# Patient Record
Sex: Female | Born: 1940 | Race: White | Hispanic: No | Marital: Married | Smoking: Current every day smoker
Health system: Southern US, Community
[De-identification: ages and names within clinical notes are randomized; demographics above are authoritative.]

## PROBLEM LIST (undated history)

## (undated) DIAGNOSIS — C50919 Malignant neoplasm of unspecified site of unspecified female breast: Secondary | ICD-10-CM

## (undated) DIAGNOSIS — Q892 Congenital malformations of other endocrine glands: Secondary | ICD-10-CM

## (undated) DIAGNOSIS — Z8669 Personal history of other diseases of the nervous system and sense organs: Secondary | ICD-10-CM

## (undated) DIAGNOSIS — I10 Essential (primary) hypertension: Secondary | ICD-10-CM

## (undated) DIAGNOSIS — IMO0002 Reserved for concepts with insufficient information to code with codable children: Secondary | ICD-10-CM

## (undated) DIAGNOSIS — Z923 Personal history of irradiation: Secondary | ICD-10-CM

## (undated) DIAGNOSIS — M329 Systemic lupus erythematosus, unspecified: Secondary | ICD-10-CM

## (undated) HISTORY — DX: Personal history of irradiation: Z92.3

## (undated) HISTORY — DX: Reserved for concepts with insufficient information to code with codable children: IMO0002

## (undated) HISTORY — DX: Malignant neoplasm of unspecified site of unspecified female breast: C50.919

## (undated) HISTORY — DX: Systemic lupus erythematosus, unspecified: M32.9

## (undated) HISTORY — DX: Personal history of other diseases of the nervous system and sense organs: Z86.69

## (undated) HISTORY — PX: TUBAL LIGATION: SHX77

## (undated) HISTORY — DX: Congenital malformations of other endocrine glands: Q89.2

## (undated) HISTORY — DX: Essential (primary) hypertension: I10

---

## 2007-07-20 ENCOUNTER — Ambulatory Visit (HOSPITAL_BASED_OUTPATIENT_CLINIC_OR_DEPARTMENT_OTHER): Admission: RE | Admit: 2007-07-20 | Discharge: 2007-07-20 | Payer: Self-pay | Admitting: Otolaryngology

## 2011-09-07 ENCOUNTER — Other Ambulatory Visit: Payer: Self-pay | Admitting: Internal Medicine

## 2011-09-07 DIAGNOSIS — N63 Unspecified lump in unspecified breast: Secondary | ICD-10-CM

## 2011-09-14 ENCOUNTER — Other Ambulatory Visit: Payer: Self-pay | Admitting: Internal Medicine

## 2011-09-14 ENCOUNTER — Ambulatory Visit
Admission: RE | Admit: 2011-09-14 | Discharge: 2011-09-14 | Disposition: A | Discharge status: Home / Self Care | Payer: Medicare Other | Source: Ambulatory Visit | Attending: Internal Medicine | Admitting: Internal Medicine

## 2011-09-14 DIAGNOSIS — N63 Unspecified lump in unspecified breast: Secondary | ICD-10-CM

## 2011-09-14 DIAGNOSIS — C50919 Malignant neoplasm of unspecified site of unspecified female breast: Secondary | ICD-10-CM

## 2011-09-14 HISTORY — DX: Malignant neoplasm of unspecified site of unspecified female breast: C50.919

## 2011-09-15 ENCOUNTER — Other Ambulatory Visit: Payer: Self-pay | Admitting: Internal Medicine

## 2011-09-15 DIAGNOSIS — C50919 Malignant neoplasm of unspecified site of unspecified female breast: Secondary | ICD-10-CM

## 2011-09-17 ENCOUNTER — Ambulatory Visit
Admission: RE | Admit: 2011-09-17 | Discharge: 2011-09-17 | Disposition: A | Discharge status: Home / Self Care | Payer: Medicare Other | Source: Ambulatory Visit | Attending: Internal Medicine | Admitting: Internal Medicine

## 2011-09-17 DIAGNOSIS — C50919 Malignant neoplasm of unspecified site of unspecified female breast: Secondary | ICD-10-CM

## 2011-09-17 MED ORDER — GADOBENATE DIMEGLUMINE 529 MG/ML IV SOLN
5.0000 mL | Freq: Once | INTRAVENOUS | Status: AC | PRN
  Administered 2011-09-17: 5 mL via INTRAVENOUS

## 2011-09-22 ENCOUNTER — Encounter (HOSPITAL_BASED_OUTPATIENT_CLINIC_OR_DEPARTMENT_OTHER): Payer: Medicare Other | Admitting: Oncology

## 2011-09-22 ENCOUNTER — Other Ambulatory Visit: Payer: Self-pay | Admitting: Oncology

## 2011-09-22 ENCOUNTER — Ambulatory Visit: Payer: Medicare Other | Admitting: Physical Therapy

## 2011-09-22 ENCOUNTER — Ambulatory Visit (HOSPITAL_BASED_OUTPATIENT_CLINIC_OR_DEPARTMENT_OTHER): Payer: Medicare Other | Admitting: General Surgery

## 2011-09-22 ENCOUNTER — Encounter (INDEPENDENT_AMBULATORY_CARE_PROVIDER_SITE_OTHER): Payer: Self-pay | Admitting: General Surgery

## 2011-09-22 VITALS — BP 171/82 | HR 89 | Temp 98.5°F | Resp 20 | Ht 59.2 in | Wt 107.4 lb

## 2011-09-22 DIAGNOSIS — Z170 Estrogen receptor positive status [ER+]: Secondary | ICD-10-CM

## 2011-09-22 DIAGNOSIS — M810 Age-related osteoporosis without current pathological fracture: Secondary | ICD-10-CM

## 2011-09-22 DIAGNOSIS — C50919 Malignant neoplasm of unspecified site of unspecified female breast: Secondary | ICD-10-CM

## 2011-09-22 DIAGNOSIS — C50419 Malignant neoplasm of upper-outer quadrant of unspecified female breast: Secondary | ICD-10-CM

## 2011-09-22 LAB — COMPREHENSIVE METABOLIC PANEL
AST: 17 U/L (ref 0–37)
BUN: 19 mg/dL (ref 6–23)
Calcium: 9.6 mg/dL (ref 8.4–10.5)
Chloride: 99 mEq/L (ref 96–112)
Creatinine, Ser: 0.99 mg/dL (ref 0.50–1.10)

## 2011-09-22 LAB — CBC WITH DIFFERENTIAL/PLATELET
Basophils Absolute: 0 10*3/uL (ref 0.0–0.1)
EOS%: 1.1 % (ref 0.0–7.0)
HCT: 41.6 % (ref 34.8–46.6)
HGB: 14.4 g/dL (ref 11.6–15.9)
LYMPH%: 25.7 % (ref 14.0–49.7)
MCH: 31.8 pg (ref 25.1–34.0)
MCV: 92.2 fL (ref 79.5–101.0)
MONO%: 6.7 % (ref 0.0–14.0)
NEUT%: 66.2 % (ref 38.4–76.8)
Platelets: 218 10*3/uL (ref 145–400)
lymph#: 1.7 10*3/uL (ref 0.9–3.3)

## 2011-09-22 NOTE — Progress Notes (Signed)
Subjective:   New diagnosis of breast cancer  Patient ID: Tammie Carter, female   DOB: [birth year], 70 y.o.   MRN: [id number]  HPI Patient is a pleasant 70 year old female patient of Dr. Timothy Carter seen in the breast multidisciplinary clinic for a new diagnosis of left breast cancer. The patient was recently able to feel a mass on breast self-exam in the upper outer quadrant of the left breast. She has not gone for routine screening. She presented to Dr. Timothy Carter and was referred to the breast center for further evaluation. I personally reviewed all her imaging and biopsy reports. Mammogram revealed a spiculated 2.5 cm mass in the upper outer quadrant of the left breast. Ultrasound confirmed a hypoechoic mass in the same area measuring 2.2 cm. 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 Subsequently bilateral breast MRI has been performed indicating a 2.3 cm mass at the area of the known malignancy and no other breast lesions. There are noted to be some mildly enlarged lymph nodes in the axilla on the left up to 1.2 cm. She has no previous history of breast disease or breast biopsy. No pain or change in her overall health. No nipple discharge or skin changes Past Medical History  Diagnosis Date  . Lupus   . Hypertension    Past Surgical History  Procedure Date  . Cesarean section    Current Outpatient Prescriptions  Medication Sig Dispense Refill  . lisinopril (PRINIVIL,ZESTRIL) 10 MG tablet Take 10 mg by mouth as needed.        . predniSONE (DELTASONE) 1 MG tablet Take 1 mg by mouth as needed.         Allergies  Allergen Reactions  . Erythromycin     Patient reports reaction to ALL antibiotics  . Penicillins   . Penicillins Cross Reactors    History  Substance Use Topics  . Smoking status: Never Smoker   . Smokeless tobacco: Not on file  . Alcohol Use: No    Review of Systems    Constitutional: Negative.   HENT: Negative.   Respiratory: Negative.   Cardiovascular: Negative.   Gastrointestinal: Negative.        Never had colonoscopy  Musculoskeletal: Negative.   Skin: Negative.   Hematological: Negative.        Objective:   Physical Exam Gen.: Well-developed alert Caucasian female in no distress Skin: Warm and dry without rash or infection HEENT: No palpable masses or thyromegaly. Sclera nonicteric. Oropharynx clear. Lymph nodes: No palpable cervical, supraclavicular, or axillary nodes. Breasts: In the upper outer quadrant of the left breast near the tail of Tammie Carter is a fairly soft but distinct freely movable mass. No overlying skin changes and no evidence of fixation. There are no other palpable masses in either breast. Again I cannot detect any axillary adenopathy. Lungs: Clear without wheezing or increased work of breathing Cardiovascular: Regular rate and rhythm without murmur. No edema or JVD. Abdomen: Soft and nontender. No discernible masses or organomegaly. Extremities: No joint swelling or deformity Neurologic: Alert and fully oriented. Gait normal.    Assessment:     70 year old female with new diagnosis of clinical T2 N0M0 ER PR positive HER-2 negative an invasive ductal carcinoma of the left breast. We discussed surgical options in detail including mastectomy or lumpectomy. She prefers breast conservation and I think would be a good candidate for lumpectomy. We discussed sentinel  lymph node biopsy for staging. We discussed the nature of the procedures, recovery, risks of anesthetic complications, bleeding, infection, possible need for further surgery based on final pathology.    Plan:     Left breast lumpectomy and sentinel lymph node biopsy which we will plan as an outpatient under general anesthesia. She has had reactions to multiple antibiotics and we will not use a preoperative antibiotic and she understands some slight risk of infection.  Likely postoperative radiation and hormonal treatment.

## 2011-09-22 NOTE — Patient Instructions (Signed)
Please call for any questions or concerns prior to your surgery.

## 2011-09-23 ENCOUNTER — Other Ambulatory Visit (INDEPENDENT_AMBULATORY_CARE_PROVIDER_SITE_OTHER): Payer: Self-pay | Admitting: General Surgery

## 2011-09-23 DIAGNOSIS — C50912 Malignant neoplasm of unspecified site of left female breast: Secondary | ICD-10-CM

## 2011-09-23 LAB — SEDIMENTATION RATE: Sed Rate: 5 mm/hr (ref 0–22)

## 2011-09-28 ENCOUNTER — Other Ambulatory Visit (HOSPITAL_COMMUNITY): Payer: Medicare Other

## 2011-09-28 ENCOUNTER — Ambulatory Visit (HOSPITAL_BASED_OUTPATIENT_CLINIC_OR_DEPARTMENT_OTHER): Admission: RE | Admit: 2011-09-28 | Payer: Medicare Other | Source: Ambulatory Visit | Admitting: General Surgery

## 2011-09-29 ENCOUNTER — Ambulatory Visit (HOSPITAL_COMMUNITY)
Admission: RE | Admit: 2011-09-29 | Discharge: 2011-09-29 | Disposition: A | Discharge status: Home / Self Care | Payer: Medicare Other | Source: Ambulatory Visit | Attending: Oncology | Admitting: Oncology

## 2011-09-29 DIAGNOSIS — C50919 Malignant neoplasm of unspecified site of unspecified female breast: Secondary | ICD-10-CM | POA: Insufficient documentation

## 2011-09-29 MED ORDER — IOHEXOL 300 MG/ML  SOLN
80.0000 mL | Freq: Once | INTRAMUSCULAR | Status: AC | PRN
  Administered 2011-09-29: 80 mL via INTRAVENOUS

## 2011-10-19 ENCOUNTER — Ambulatory Visit: Payer: Medicare Other | Admitting: Radiation Oncology

## 2011-10-25 ENCOUNTER — Telehealth: Payer: Self-pay | Admitting: *Deleted

## 2011-10-25 NOTE — Telephone Encounter (Signed)
Call received from scheduling per pt's call to verify appt date. Pt is scheduled for md f/u pm 11/16. Christal is asking if her appt with md should be before or after surgery by Dr Johna Sheriff which is after 11/16.  Per md above inquiry needs to be discussed with Museum/gallery exhibitions officer.  This rn call Dawn and inquired above.  Dawn will f/u with inquiry.

## 2011-10-26 ENCOUNTER — Telehealth: Payer: Self-pay | Admitting: *Deleted

## 2011-10-26 ENCOUNTER — Other Ambulatory Visit (INDEPENDENT_AMBULATORY_CARE_PROVIDER_SITE_OTHER): Payer: Self-pay | Admitting: General Surgery

## 2011-10-26 LAB — C-REACTIVE PROTEIN: CRP: 0.14 mg/dL (ref <0.60)

## 2011-10-26 NOTE — Telephone Encounter (Signed)
Left message for pt to call back concerning appt with Dr. Darnelle Catalan on 11/05/11.  Per Dr. Darnelle Catalan, pt should come in to be assessed on tolerance of Femara.

## 2011-10-28 ENCOUNTER — Telehealth: Payer: Self-pay | Admitting: *Deleted

## 2011-10-28 NOTE — Telephone Encounter (Signed)
Spoke to pt concerning appt with Dr. Darnelle Catalan on 11/05/11.  Pt questioned why she needed to keep appt?  Informed pt Dr. Darnelle Catalan wants to see how she is responding to Femara and how she is tolerating the medication.  Pt verbalized understanding.  Encourage pt to call with needs or concerns.  Received verbal understanding.  Contact information given.

## 2011-11-03 ENCOUNTER — Ambulatory Visit: Admission: RE | Admit: 2011-11-03 | Payer: Medicare Other | Source: Ambulatory Visit

## 2011-11-03 ENCOUNTER — Ambulatory Visit: Payer: Medicare Other | Admitting: Radiation Oncology

## 2011-11-04 ENCOUNTER — Encounter: Payer: Self-pay | Admitting: *Deleted

## 2011-11-05 ENCOUNTER — Telehealth: Payer: Self-pay | Admitting: Oncology

## 2011-11-05 ENCOUNTER — Other Ambulatory Visit: Payer: Self-pay | Admitting: Oncology

## 2011-11-05 ENCOUNTER — Ambulatory Visit (HOSPITAL_BASED_OUTPATIENT_CLINIC_OR_DEPARTMENT_OTHER): Payer: Medicare Other | Admitting: Oncology

## 2011-11-05 ENCOUNTER — Other Ambulatory Visit (HOSPITAL_BASED_OUTPATIENT_CLINIC_OR_DEPARTMENT_OTHER): Payer: Medicare Other | Admitting: Lab

## 2011-11-05 VITALS — BP 173/96 | HR 79 | Temp 97.5°F | Ht 59.2 in | Wt 109.2 lb

## 2011-11-05 DIAGNOSIS — Z170 Estrogen receptor positive status [ER+]: Secondary | ICD-10-CM

## 2011-11-05 DIAGNOSIS — C50919 Malignant neoplasm of unspecified site of unspecified female breast: Secondary | ICD-10-CM

## 2011-11-05 DIAGNOSIS — C50419 Malignant neoplasm of upper-outer quadrant of unspecified female breast: Secondary | ICD-10-CM

## 2011-11-05 DIAGNOSIS — I10 Essential (primary) hypertension: Secondary | ICD-10-CM | POA: Insufficient documentation

## 2011-11-05 DIAGNOSIS — M810 Age-related osteoporosis without current pathological fracture: Secondary | ICD-10-CM | POA: Insufficient documentation

## 2011-11-05 DIAGNOSIS — F17201 Nicotine dependence, unspecified, in remission: Secondary | ICD-10-CM

## 2011-11-05 DIAGNOSIS — M329 Systemic lupus erythematosus, unspecified: Secondary | ICD-10-CM | POA: Insufficient documentation

## 2011-11-05 DIAGNOSIS — R5383 Other fatigue: Secondary | ICD-10-CM

## 2011-11-05 DIAGNOSIS — Z87798 Personal history of other (corrected) congenital malformations: Secondary | ICD-10-CM

## 2011-11-05 DIAGNOSIS — R5381 Other malaise: Secondary | ICD-10-CM

## 2011-11-05 LAB — CBC WITH DIFFERENTIAL/PLATELET
BASO%: 0.5 % (ref 0.0–2.0)
EOS%: 1.2 % (ref 0.0–7.0)
MCH: 31.6 pg (ref 25.1–34.0)
MCV: 93.3 fL (ref 79.5–101.0)
MONO%: 6.2 % (ref 0.0–14.0)
NEUT#: 5 10*3/uL (ref 1.5–6.5)
RBC: 4.45 10*6/uL (ref 3.70–5.45)
RDW: 12.9 % (ref 11.2–14.5)

## 2011-11-05 NOTE — Telephone Encounter (Signed)
Gv pt appt for [id number] °

## 2011-11-05 NOTE — Progress Notes (Signed)
ID: Benna Dunks   Interval History:  Brealyn returns today with her husband [name] for followup of her breast cancer. At the last visit here we had set her up for surgery, but for insurance reasons she decided to delay that until January. We wrote her for letrozole but when she tried to get the drug initially they wanted to charger more than $400 a month. She eventually work it all out and was able to get it for the correct price of $13 a month. She only started a week ago. So far she is having no side effects from  ROS:  She describes herself is moderately fatigued and Tom tells me that she is sleeping a bit more than usual. She has had no fevers no rash no altered taste or loss of appetite, no cough or phlegm production no change in bowel or bladder habits no drenching sweats. In short there are no symptoms suggestive of a lupus flare within the moderate fatigue. A detailed review of systems was otherwise unremarkable  Medications: I have reviewed the patient's current medications.   Current Outpatient Prescriptions  Medication Sig Dispense Refill  . letrozole (FEMARA) 2.5 MG tablet Take 2.5 mg by mouth daily.        Marland Kitchen lisinopril (PRINIVIL,ZESTRIL) 10 MG tablet Take 10 mg by mouth daily.       . predniSONE (DELTASONE) 1 MG tablet Take 1 mg by mouth as needed.           Objective: Vital signs in last 24 hours: BP 173/96  Pulse 79  Temp 97.5 F (36.4 C)  Ht 4' 11.2" (1.504 m)  Wt 109 lb 3.2 oz (49.533 kg)  BMI 21.91 kg/m2   Physical Exam:    Sclerae unicteric  Oropharynx clear  Lungs clear -- no rales or rhonchi  Heart regular rate and rhythm  Abdomen benign  MSK no focal spinal tenderness, no peripheral edema  Neuro nonfocal  Breast exam: Right breast no suspicious findings; left breast shows the mass most easily palpated when coming and laterally into the upper outer quadrant. There is no skin change or erythema no nipple retraction in the sitting position there is a small  palpable left axillary lymph node which is movable nontender and not associated with any swelling or inflammation  Lab Results:   CMP  Lab Results  Component Value Date   GLUCOSE 91 09/22/2011   ALT 13 09/22/2011   AST 17 09/22/2011   NA 134* 09/22/2011   K 4.5 09/22/2011   CL 99 09/22/2011   CREATININE 0.99 09/22/2011   BUN 19 09/22/2011   CO2 28 09/22/2011     Lab Results  Component Value Date   WBC 7.1 11/05/2011   HGB 14.1 11/05/2011   HCT 41.5 11/05/2011   MCV 93.3 11/05/2011   PLT 200 11/05/2011        Studies/Results: CT scan of the chest obtained 09/29/2011 the left breast mass measuring 2.0 cm cyst versus 2.3 cm by MRI). Evidence of axillary or other adenopathy and no evidence of metastatic disease  Assessment:  70 year old [place] woman status post left breast biopsy September of 2012 for what by MRI is a 2.3 cm mass and pathologically is a grade 1 invasive ductal carcinoma, strongly estrogen and progesterone receptor positive with an MIB-1-104% and no HER-2 amplification.   Plan:  The plan is to continue the letrozole and I have no objections to her I continue to take it right through the surgery and  radiation. She will call if she develops any side effects from this medication and she has a good understanding of the possible toxicities side effects and complications it can cause, as well as the benefits. Certainly she may already have some response by the time she has her definitive surgery under Dr. Nickola Major worked in January. Be seeing Dr. Dayton Scrape in her this month to discuss radiation issues, which of course will operation to lyse after the surgery. Made a Yeraldy a return appointment here for February with me she knows to call for any problems that may develop before that.  Upton Russey C 11/05/2011

## 2011-11-06 ENCOUNTER — Encounter: Payer: Self-pay | Admitting: *Deleted

## 2011-11-06 NOTE — Progress Notes (Signed)
Path:09/14/11: left breast bx 2 O'clock 'invasive ductal carcinoma,er/pr=positive,her2 =neg.  Married,3/4 living children,  Allergies; erythromycin,pcn,

## 2011-11-09 ENCOUNTER — Ambulatory Visit
Admission: RE | Admit: 2011-11-09 | Discharge: 2011-11-09 | Disposition: A | Discharge status: Home / Self Care | Payer: Medicare Other | Source: Ambulatory Visit | Attending: Radiation Oncology | Admitting: Radiation Oncology

## 2011-11-09 DIAGNOSIS — C50419 Malignant neoplasm of upper-outer quadrant of unspecified female breast: Secondary | ICD-10-CM | POA: Insufficient documentation

## 2011-11-09 DIAGNOSIS — C50919 Malignant neoplasm of unspecified site of unspecified female breast: Secondary | ICD-10-CM

## 2011-11-09 NOTE — Progress Notes (Signed)
CC:   Tammie Pounds, MD Tammie Carter. Hoxworth, M.D. Tammie Carter, M.D.  DIAGNOSIS:  Clinical stage II (T2 N0 M0) invasive ductal carcinoma of the left breast.  NARRATIVE:  [name] returns today for review.  Her surgery with Dr. Johna Sheriff has been postponed until January.  Dr. Darnelle Catalan has her on Femara in the meantime.  When I saw [name] in consultation I spoke with her primary care physician, Dr. Timothy Lasso, regarding her diagnosis of "lupus".  He felt that if she had a normal sedimentation rate and C- reactive protein then we can assume that she does not really have active lupus, therefore, she would probably do fine with radiation therapy. Again, there is no history of vasculitis.  Her sedimentation rate from 09/22/2011 was within normal limits at 5 and her C-reactive protein from 10/26/2011 was 0.14, also within normal limits.  She tells me that she takes 1 mg of prednisone p.r.n., but she does not take it for any specific symptoms.  PHYSICAL EXAMINATION:  General: Alert and oriented.  Vital signs:  Blood pressure 152/87, pulse 70 and regular, respiratory rate 20, and temperature 97.9.  Head and Neck Examination: Grossly unremarkable. Nodes: Without palpable cervical, supraclavicular, or axillary adenopathy.  Chest: Lungs clear.  Heart: Regular rate and rhythm. Breasts: There is a soft mass along the axillary tail of the left breast which is poorly defined measuring 2-2.5 cm in greatest diameter.  No other lesions are appreciated.  Right breast without masses or lesions. Abdomen:  Soft without masses or organomegaly.  Extremities:  Without edema.  IMPRESSION:  Clinical stage II (T2 N0 M0) invasive ductal carcinoma of the left breast.  [name] is awaiting her surgery in January.  She is to contact me with the date of her surgery, and I will see her for a followup visit 1-2 weeks postop.  We can discuss radiation therapy in greater detail at that time.  Based on her  sedimentation rate and C- reactive protein we should not expect any significant toxicity from her radition based on her diagnosis of "lupus."  Thirty minutes was spent face-to-face with the patient, primarily counseling the patient.    ______________________________ Maryln Gottron, M.D. RJM/MEDQ  D:  11/09/2011  T:  11/09/2011  Job:  [id number]

## 2011-11-09 NOTE — Progress Notes (Signed)
CONSULT FOR LEFT BREAST CANCER.  STARTED FEMARA Oct 28, 2011

## 2011-11-09 NOTE — Progress Notes (Signed)
Please see the Nurse Progress Note in the MD Initial Consult Encounter for this patient. 

## 2011-11-25 ENCOUNTER — Other Ambulatory Visit (INDEPENDENT_AMBULATORY_CARE_PROVIDER_SITE_OTHER): Payer: Self-pay | Admitting: General Surgery

## 2011-11-25 DIAGNOSIS — C50912 Malignant neoplasm of unspecified site of left female breast: Secondary | ICD-10-CM

## 2011-12-02 ENCOUNTER — Telehealth: Payer: Self-pay

## 2011-12-02 NOTE — Telephone Encounter (Signed)
Received call from pt reporting she has been on Femara for "a little over 2 months," and her side effects are getting progressively worse.  Pt reports joint pain, fatigue and GI upset.  Pt questions "what should I do?"    Note to Dr Thea Silversmith. dph

## 2011-12-02 NOTE — Telephone Encounter (Signed)
Returned pt's call re: question about Femara.   Per Dr Thea Silversmith, pt to d/c Femara x 6 weeks, then call our office to notify if sx resolved.    Pt verbalizes understanding. dph

## 2011-12-29 ENCOUNTER — Encounter (HOSPITAL_BASED_OUTPATIENT_CLINIC_OR_DEPARTMENT_OTHER): Payer: Self-pay | Admitting: *Deleted

## 2011-12-29 NOTE — Progress Notes (Signed)
To come in for bmet and ekg 

## 2011-12-30 ENCOUNTER — Encounter (INDEPENDENT_AMBULATORY_CARE_PROVIDER_SITE_OTHER): Payer: Self-pay | Admitting: General Surgery

## 2011-12-30 ENCOUNTER — Other Ambulatory Visit: Payer: Self-pay

## 2011-12-30 ENCOUNTER — Encounter (HOSPITAL_BASED_OUTPATIENT_CLINIC_OR_DEPARTMENT_OTHER)
Admission: RE | Admit: 2011-12-30 | Discharge: 2011-12-30 | Disposition: A | Discharge status: Home / Self Care | Payer: Medicare Other | Source: Ambulatory Visit | Attending: General Surgery | Admitting: General Surgery

## 2011-12-30 ENCOUNTER — Ambulatory Visit (INDEPENDENT_AMBULATORY_CARE_PROVIDER_SITE_OTHER): Payer: Medicare Other | Admitting: General Surgery

## 2011-12-30 DIAGNOSIS — C50419 Malignant neoplasm of upper-outer quadrant of unspecified female breast: Secondary | ICD-10-CM

## 2011-12-30 LAB — BASIC METABOLIC PANEL
GFR calc Af Amer: 53 mL/min — ABNORMAL LOW (ref >90)
GFR calc non Af Amer: 45 mL/min — ABNORMAL LOW (ref >90)
Potassium: 4.7 mEq/L (ref 3.5–5.1)
Sodium: 141 mEq/L (ref 135–145)

## 2011-12-30 NOTE — Progress Notes (Signed)
Subjective:   Cancer left breast  Patient ID: Tammie Carter, female   DOB: 04-Jan-1941, 71 y.o.   MRN: [id number]  HPI Patient returns for a preoperative visit prior to planned left breast lumpectomy and sentinel lymph node biopsy.   Patient is a pleasant 71 year old female patient of Dr. Timothy Lasso seen in the breast multidisciplinary clinic for a new diagnosis of left breast cancer. The patient was recently able to feel a mass on breast self-exam in the upper outer quadrant of the left breast. She has not gone for routine screening. She presented to Dr. Timothy Lasso and was referred to the breast center for further evaluation. I personally reviewed all her imaging and biopsy reports. Mammogram revealed a spiculated 2.5 cm mass in the upper outer quadrant of the left breast. Ultrasound confirmed a hypoechoic mass in the same area measuring 2.2 cm. 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 Subsequently bilateral breast MRI has been performed indicating a 2.3 cm mass at the area of the known malignancy and no other breast lesions. There are noted to be some mildly enlarged lymph nodes in the axilla on the left up to 1.2 cm. She has no previous history of breast disease or breast biopsy. No pain or change in her overall health. No nipple discharge or skin changes.  She was temporarily on letrozole but was unable to tolerate this due to side effects.  Past Medical History  Diagnosis Date  . Lupus   . Hypertension   . Breast cancer 09/14/11    left breast =invasive ductal ca dx  . Osteoporosis   . Hx of migraines   . Thyroglossal duct cyst    Past Surgical History  Procedure Date  . Tubal ligation   . Cesarean section     x2   Current Outpatient Prescriptions  Medication Sig Dispense Refill  . lisinopril (PRINIVIL,ZESTRIL) 10 MG tablet Take 10 mg by mouth daily.       . predniSONE (DELTASONE) 1  MG tablet Take 1 mg by mouth as needed.         Allergies  Allergen Reactions  . Codeine Anaphylaxis  . Erythromycin     Patient reports reaction to ALL antibiotics  . Letrozole     Made the pt's muscles and bones ache  . Penicillins   . Penicillins Cross Reactors     Review of Systems  Constitutional: Negative.   Respiratory: Negative.   Cardiovascular: Negative.   Gastrointestinal: Negative.        Objective:   Physical Exam General: Alert, well-developed Caucasian female, in no distress Skin: Warm and dry without rash or infection. HEENT: No palpable masses or thyromegaly. Sclera nonicteric. Pupils equal round and reactive. Oropharynx clear. Breasts: Again noted, unchanged since October, is a soft, discrete, freely movable, approximately 3 cm mass in the upper outer quadrant of the left breast in the area the tail of Spence. Again no palpable adenopathy. Lymph nodes: No cervical, supraclavicular, or inguinal nodes palpable. Lungs: Breath sounds clear and equal without increased work of breathing Cardiovascular: Regular rate and rhythm without murmur. No JVD or edema. Peripheral pulses intact. Abdomen: Nondistended. Soft and nontender. No masses palpable. No organomegaly. No palpable hernias. Extremities: No edema or joint swelling or deformity. No chronic venous stasis changes. Neurologic: Alert and fully oriented. Gait normal.     Assessment:     Cancer of  the left breast. We reviewed our plan for left breast lumpectomy and sentinel lymph node biopsy. We discussed the nature of the surgery and recovery as well as risks of bleeding, infection, possible need for further surgery based on final pathology.    Plan:     For left breast lumpectomy and left axillary sentinel lymph node biopsy.

## 2011-12-31 ENCOUNTER — Ambulatory Visit (HOSPITAL_BASED_OUTPATIENT_CLINIC_OR_DEPARTMENT_OTHER): Payer: Medicare Other | Admitting: Certified Registered Nurse Anesthetist

## 2011-12-31 ENCOUNTER — Encounter (HOSPITAL_BASED_OUTPATIENT_CLINIC_OR_DEPARTMENT_OTHER): Payer: Self-pay | Admitting: Certified Registered Nurse Anesthetist

## 2011-12-31 ENCOUNTER — Ambulatory Visit (HOSPITAL_COMMUNITY)
Admission: RE | Admit: 2011-12-31 | Discharge: 2011-12-31 | Disposition: A | Discharge status: Home / Self Care | Payer: Medicare Other | Source: Ambulatory Visit | Attending: General Surgery | Admitting: General Surgery

## 2011-12-31 ENCOUNTER — Other Ambulatory Visit (INDEPENDENT_AMBULATORY_CARE_PROVIDER_SITE_OTHER): Payer: Self-pay | Admitting: General Surgery

## 2011-12-31 ENCOUNTER — Encounter (HOSPITAL_BASED_OUTPATIENT_CLINIC_OR_DEPARTMENT_OTHER): Payer: Self-pay | Admitting: *Deleted

## 2011-12-31 ENCOUNTER — Ambulatory Visit (HOSPITAL_BASED_OUTPATIENT_CLINIC_OR_DEPARTMENT_OTHER)
Admission: RE | Admit: 2011-12-31 | Discharge: 2011-12-31 | Disposition: A | Discharge status: Home / Self Care | Payer: Medicare Other | Source: Ambulatory Visit | Attending: General Surgery | Admitting: General Surgery

## 2011-12-31 ENCOUNTER — Encounter (HOSPITAL_BASED_OUTPATIENT_CLINIC_OR_DEPARTMENT_OTHER)
Admission: RE | Disposition: A | Discharge status: Home / Self Care | Payer: Self-pay | Source: Ambulatory Visit | Attending: General Surgery

## 2011-12-31 DIAGNOSIS — D0590 Unspecified type of carcinoma in situ of unspecified breast: Secondary | ICD-10-CM | POA: Insufficient documentation

## 2011-12-31 DIAGNOSIS — M810 Age-related osteoporosis without current pathological fracture: Secondary | ICD-10-CM | POA: Insufficient documentation

## 2011-12-31 DIAGNOSIS — C50912 Malignant neoplasm of unspecified site of left female breast: Secondary | ICD-10-CM

## 2011-12-31 DIAGNOSIS — I10 Essential (primary) hypertension: Secondary | ICD-10-CM | POA: Insufficient documentation

## 2011-12-31 DIAGNOSIS — C50419 Malignant neoplasm of upper-outer quadrant of unspecified female breast: Secondary | ICD-10-CM | POA: Insufficient documentation

## 2011-12-31 DIAGNOSIS — C773 Secondary and unspecified malignant neoplasm of axilla and upper limb lymph nodes: Secondary | ICD-10-CM | POA: Insufficient documentation

## 2011-12-31 DIAGNOSIS — M329 Systemic lupus erythematosus, unspecified: Secondary | ICD-10-CM | POA: Insufficient documentation

## 2011-12-31 DIAGNOSIS — C50919 Malignant neoplasm of unspecified site of unspecified female breast: Secondary | ICD-10-CM

## 2011-12-31 DIAGNOSIS — Z87891 Personal history of nicotine dependence: Secondary | ICD-10-CM | POA: Insufficient documentation

## 2011-12-31 DIAGNOSIS — Z170 Estrogen receptor positive status [ER+]: Secondary | ICD-10-CM | POA: Insufficient documentation

## 2011-12-31 HISTORY — PX: BREAST LUMPECTOMY: SHX2

## 2011-12-31 SURGERY — BREAST LUMPECTOMY WITH SENTINEL LYMPH NODE BX
Anesthesia: General | Site: Breast | Laterality: Left | Wound class: Clean

## 2011-12-31 MED ORDER — DEXAMETHASONE SODIUM PHOSPHATE 4 MG/ML IJ SOLN
INTRAMUSCULAR | Status: DC | PRN
  Administered 2011-12-31: 10 mg via INTRAVENOUS

## 2011-12-31 MED ORDER — BUPIVACAINE-EPINEPHRINE 0.25% -1:200000 IJ SOLN
INTRAMUSCULAR | Status: DC | PRN
  Administered 2011-12-31: 30 mL

## 2011-12-31 MED ORDER — PROPOFOL 10 MG/ML IV EMUL
INTRAVENOUS | Status: DC | PRN
  Administered 2011-12-31: 120 mg via INTRAVENOUS

## 2011-12-31 MED ORDER — FENTANYL CITRATE 0.05 MG/ML IJ SOLN
50.0000 ug | INTRAMUSCULAR | Status: DC | PRN
  Administered 2011-12-31: 50 ug via INTRAVENOUS

## 2011-12-31 MED ORDER — MORPHINE SULFATE 2 MG/ML IJ SOLN: 0.0500 mg/kg | INTRAMUSCULAR | Status: DC | PRN

## 2011-12-31 MED ORDER — MORPHINE SULFATE 15 MG PO TABS: 15.0000 mg | ORAL_TABLET | ORAL | Status: AC | PRN

## 2011-12-31 MED ORDER — LIDOCAINE HCL (CARDIAC) 20 MG/ML IV SOLN
INTRAVENOUS | Status: DC | PRN
  Administered 2011-12-31: 60 mg via INTRAVENOUS

## 2011-12-31 MED ORDER — ACETAMINOPHEN 10 MG/ML IV SOLN: 1000.0000 mg | Freq: Once | INTRAVENOUS | Status: DC

## 2011-12-31 MED ORDER — FENTANYL CITRATE 0.05 MG/ML IJ SOLN
INTRAMUSCULAR | Status: DC | PRN
  Administered 2011-12-31: 25 ug via INTRAVENOUS
  Administered 2011-12-31: 50 ug via INTRAVENOUS
  Administered 2011-12-31: 25 ug via INTRAVENOUS

## 2011-12-31 MED ORDER — METOCLOPRAMIDE HCL 5 MG/ML IJ SOLN
INTRAMUSCULAR | Status: DC | PRN
  Administered 2011-12-31: 10 mg via INTRAVENOUS

## 2011-12-31 MED ORDER — FENTANYL CITRATE 0.05 MG/ML IJ SOLN: 25.0000 ug | INTRAMUSCULAR | Status: DC | PRN

## 2011-12-31 MED ORDER — TECHNETIUM TC 99M SULFUR COLLOID FILTERED
1.0000 | Freq: Once | INTRAVENOUS | Status: AC | PRN
Start: 2011-12-31 — End: 2011-12-31
  Administered 2011-12-31: 1 via INTRADERMAL

## 2011-12-31 MED ORDER — SODIUM CHLORIDE 0.9 % IJ SOLN
INTRAMUSCULAR | Status: DC | PRN
  Administered 2011-12-31: 08:00:00 via INTRAMUSCULAR

## 2011-12-31 MED ORDER — CIPROFLOXACIN IN D5W 400 MG/200ML IV SOLN
400.0000 mg | INTRAVENOUS | Status: AC
  Administered 2011-12-31: 400 mg via INTRAVENOUS

## 2011-12-31 MED ORDER — METOCLOPRAMIDE HCL 5 MG/ML IJ SOLN: 10.0000 mg | Freq: Once | INTRAMUSCULAR | Status: DC | PRN

## 2011-12-31 MED ORDER — LACTATED RINGERS IV SOLN
INTRAVENOUS | Status: DC
  Administered 2011-12-31 (×2): via INTRAVENOUS

## 2011-12-31 MED ORDER — MIDAZOLAM HCL 2 MG/2ML IJ SOLN
0.5000 mg | INTRAMUSCULAR | Status: DC | PRN
  Administered 2011-12-31: 1 mg via INTRAVENOUS

## 2011-12-31 MED ORDER — ONDANSETRON HCL 4 MG/2ML IJ SOLN
INTRAMUSCULAR | Status: DC | PRN
  Administered 2011-12-31: 4 mg via INTRAVENOUS

## 2011-12-31 SURGICAL SUPPLY — 61 items
APPLIER CLIP 11 MED OPEN (CLIP) ×2
BINDER BREAST MEDIUM (GAUZE/BANDAGES/DRESSINGS) ×2 IMPLANT
BLADE SURG 10 STRL SS (BLADE) ×2 IMPLANT
BLADE SURG 15 STRL LF DISP TIS (BLADE) ×1 IMPLANT
BLADE SURG 15 STRL SS (BLADE) ×1
CANISTER SUCTION 1200CC (MISCELLANEOUS) ×2 IMPLANT
CHLORAPREP W/TINT 26ML (MISCELLANEOUS) ×2 IMPLANT
CLIP APPLIE 11 MED OPEN (CLIP) ×1 IMPLANT
CLIP TI WIDE RED SMALL 6 (CLIP) ×2 IMPLANT
CLOTH BEACON ORANGE TIMEOUT ST (SAFETY) ×2 IMPLANT
COVER MAYO STAND STRL (DRAPES) ×2 IMPLANT
COVER PROBE W GEL 5X96 (DRAPES) ×2 IMPLANT
COVER TABLE BACK 60X90 (DRAPES) ×2 IMPLANT
DECANTER SPIKE VIAL GLASS SM (MISCELLANEOUS) IMPLANT
DERMABOND ADVANCED (GAUZE/BANDAGES/DRESSINGS) ×1
DERMABOND ADVANCED .7 DNX12 (GAUZE/BANDAGES/DRESSINGS) ×1 IMPLANT
DEVICE DUBIN W/COMP PLATE 8390 (MISCELLANEOUS) ×2 IMPLANT
DRAIN CHANNEL 19F RND (DRAIN) IMPLANT
DRAIN HEMOVAC 1/8 X 5 (WOUND CARE) IMPLANT
DRAPE LAPAROSCOPIC ABDOMINAL (DRAPES) ×2 IMPLANT
DRAPE UTILITY XL STRL (DRAPES) ×2 IMPLANT
ELECT COATED BLADE 2.86 ST (ELECTRODE) ×2 IMPLANT
ELECT REM PT RETURN 9FT ADLT (ELECTROSURGICAL) ×2
ELECTRODE REM PT RTRN 9FT ADLT (ELECTROSURGICAL) ×1 IMPLANT
EVACUATOR SILICONE 100CC (DRAIN) IMPLANT
GLOVE BIO SURGEON STRL SZ 6.5 (GLOVE) ×2 IMPLANT
GLOVE BIO SURGEON STRL SZ7 (GLOVE) ×2 IMPLANT
GLOVE BIOGEL PI IND STRL 7.5 (GLOVE) ×1 IMPLANT
GLOVE BIOGEL PI IND STRL 8 (GLOVE) ×1 IMPLANT
GLOVE BIOGEL PI INDICATOR 7.5 (GLOVE) ×1
GLOVE BIOGEL PI INDICATOR 8 (GLOVE) ×1
GLOVE SS BIOGEL STRL SZ 7.5 (GLOVE) ×1 IMPLANT
GLOVE SUPERSENSE BIOGEL SZ 7.5 (GLOVE) ×1
GOWN PREVENTION PLUS XLARGE (GOWN DISPOSABLE) ×2 IMPLANT
GOWN PREVENTION PLUS XXLARGE (GOWN DISPOSABLE) ×4 IMPLANT
KIT MARKER MARGIN INK (KITS) ×2 IMPLANT
NDL SAFETY ECLIPSE 18X1.5 (NEEDLE) ×1 IMPLANT
NEEDLE HYPO 18GX1.5 SHARP (NEEDLE) ×1
NEEDLE HYPO 25X1 1.5 SAFETY (NEEDLE) ×4 IMPLANT
NS IRRIG 1000ML POUR BTL (IV SOLUTION) ×2 IMPLANT
PACK BASIN DAY SURGERY FS (CUSTOM PROCEDURE TRAY) ×2 IMPLANT
PAD ALCOHOL SWAB (MISCELLANEOUS) ×2 IMPLANT
PENCIL BUTTON HOLSTER BLD 10FT (ELECTRODE) ×2 IMPLANT
PIN SAFETY STERILE (MISCELLANEOUS) IMPLANT
SLEEVE SCD COMPRESS KNEE MED (MISCELLANEOUS) ×2 IMPLANT
SPONGE LAP 18X18 X RAY DECT (DISPOSABLE) IMPLANT
SPONGE LAP 4X18 X RAY DECT (DISPOSABLE) ×2 IMPLANT
STAPLER VISISTAT 35W (STAPLE) IMPLANT
SUT ETHILON 3 0 FSL (SUTURE) IMPLANT
SUT MON AB 4-0 PC3 18 (SUTURE) IMPLANT
SUT MON AB 5-0 PS2 18 (SUTURE) ×2 IMPLANT
SUT SILK 3 0 SH 30 (SUTURE) IMPLANT
SUT VIC AB 3-0 SH 27 (SUTURE)
SUT VIC AB 3-0 SH 27X BRD (SUTURE) IMPLANT
SUT VICRYL 3-0 CR8 SH (SUTURE) ×2 IMPLANT
SYR CONTROL 10ML LL (SYRINGE) ×4 IMPLANT
TOWEL OR 17X24 6PK STRL BLUE (TOWEL DISPOSABLE) ×4 IMPLANT
TOWEL OR NON WOVEN STRL DISP B (DISPOSABLE) ×2 IMPLANT
TUBE CONNECTING 20X1/4 (TUBING) ×2 IMPLANT
WATER STERILE IRR 1000ML POUR (IV SOLUTION) IMPLANT
YANKAUER SUCT BULB TIP NO VENT (SUCTIONS) ×2 IMPLANT

## 2011-12-31 NOTE — Op Note (Signed)
Preoperative Diagnosis: left breast cancer  Postoprative Diagnosis: left breast cancer  Procedure: Procedure(s): Left BREAST LUMPECTOMY WITH SENTINEL LYMPH NODE BX, Blue dye injection left breast   Surgeon: Glenna Fellows T   Assistants: None  Anesthesia:  General LMA anesthesia  Indications:  Patient is a 71 year old female who has presented with a palpable mass in the upper-outer quadrant of the left breast near the tail of Spence. On imaging it is approximately 2.5 cm and biopsy has revealed invasive ductal carcinoma. We discussed surgical treatment options and elected to proceed with lumpectomy with axillary sentinel lymph node biopsy. We discussed the nature of the procedure, indications, risks of general anesthesia, bleeding, infection, and possibly for further surgery based on final pathology.   Procedure Detail: Prior to surgery the patient underwent injection of 1 mCi of technetium sulfur colloid around the left nipple. She was brought to the operating room, placed in supine position on operating table, laryngeal mask general anesthesia induced. The left breast chest axilla and upper arm were widely sterilely prepped and draped. Under sterile technique I had injected 10 cc of dilute methylene blue subcutaneously beneath the left nipple and massaged this for several minutes. The mass was palpable near the tail of Spence. I made a curvilinear incision directly over the mass and dissection was carried down into the subcutaneous tissue toward the breast capsule. Palpating the mass I deepened the dissection down in all directions taking a nice rim of normal breast tissue around the mass. The dissection was deepened down to the chest wall and the specimen was dissected up off of the edge of the pectoralis and the deep dissection was right down to the muscle. The mass appeared well contained within the specimen. It was oriented with a consent for permanent section. Complete hemostasis was  obtained with cautery. I was able to expose the axilla through the same incision. A hot area with the neoprobe was localized and using cautery and blunt dissection I dissected down into the axilla and using the neoprobe for guidance I came down to a fairly deep high lymph node that was slightly firm and enlarged but not obviously abnormal. It had high counts. Using blunt and cautery dissection it was dissected away entirely and removed. Ex vivo the node had counts of over 1000 and background of the axilla was less than 20. This was sent as a hot left axillary sentinel lymph node. There were no other palpable abnormalities in the axilla. The wound was then thoroughly irrigated with saline and infiltrated with Marcaine with epinephrine. Hemostasis was assured. The deeper tissues closed with interrupted 3-0 Vicryl as was the subcutaneous tissue and the skin was closed with subcuticular 4-0 Monocryl and Dermabond. Sponge needle and instrument counts were correct. The patient was taken to recovery in good condition.   Estimated Blood Loss:  Minimal         Drains: none  Blood Given: none          Specimens: #1 left breast lumpectomy #2 left axillary sentinel lymph node both for permanent pathology        Complications:  * No complications entered in OR log *         Disposition: PACU - hemodynamically stable.         Condition: stable  Mariella Saa MD, FACS  12/31/2011, 9:40 AM

## 2011-12-31 NOTE — Interval H&P Note (Signed)
History and Physical Interval Note:  12/31/2011 7:53 AM  Tammie Carter  has presented today for surgery, with the diagnosis of left breast cancer  The various methods of treatment have been discussed with the patient and family. After consideration of risks, benefits and other options for treatment, the patient has consented to  Procedure(s): BREAST LUMPECTOMY WITH SENTINEL LYMPH NODE BX as a surgical intervention .  The patients' history has been reviewed, patient examined, no change in status, stable for surgery.  I have reviewed the patients' chart and labs.  Questions were answered to the patient's satisfaction.     [name]

## 2011-12-31 NOTE — Anesthesia Procedure Notes (Signed)
Procedure Name: LMA Insertion Date/Time: 12/31/2011 8:08 AM Performed by: Saifan Rayford D Pre-anesthesia Checklist: Patient identified, Emergency Drugs available, Suction available and Patient being monitored Patient Re-evaluated:Patient Re-evaluated prior to inductionOxygen Delivery Method: Circle System Utilized Preoxygenation: Pre-oxygenation with 100% oxygen Intubation Type: IV induction Ventilation: Mask ventilation without difficulty LMA: LMA inserted LMA Size: 4.0 Number of attempts: 1 Placement Confirmation: positive ETCO2 Tube secured with: Tape Dental Injury: Teeth and Oropharynx as per pre-operative assessment

## 2011-12-31 NOTE — Anesthesia Preprocedure Evaluation (Signed)
Anesthesia Evaluation  Patient identified by MRN, date of birth, ID band Patient awake    Reviewed: Allergy & Precautions, H&P , NPO status , Patient's Chart, lab work & pertinent test results, reviewed documented beta blocker date and time   Airway Mallampati: II TM Distance: >3 FB Neck ROM: full    Dental   Pulmonary neg pulmonary ROS, former smoker         Cardiovascular hypertension, On Medications neg cardio ROS     Neuro/Psych Negative Neurological ROS  Negative Psych ROS   GI/Hepatic negative GI ROS, Neg liver ROS,   Endo/Other  Negative Endocrine ROS  Renal/GU negative Renal ROS  Genitourinary negative   Musculoskeletal   Abdominal   Peds  Hematology negative hematology ROS (+)   Anesthesia Other Findings See surgeon's H&P   SLE noted  Reproductive/Obstetrics negative OB ROS                           Anesthesia Physical Anesthesia Plan  ASA: II  Anesthesia Plan: General   Post-op Pain Management:    Induction: Intravenous  Airway Management Planned: LMA  Additional Equipment:   Intra-op Plan:   Post-operative Plan: Extubation in OR  Informed Consent: I have reviewed the patients History and Physical, chart, labs and discussed the procedure including the risks, benefits and alternatives for the proposed anesthesia with the patient or authorized representative who has indicated his/her understanding and acceptance.     Plan Discussed with: CRNA and Surgeon  Anesthesia Plan Comments:         Anesthesia Quick Evaluation

## 2011-12-31 NOTE — H&P (View-Only) (Signed)
Subjective:   Cancer left breast  Patient ID: Tammie Carter, female   DOB: 08/21/1941, 70 y.o.   MRN: [id number]  HPI Patient returns for a preoperative visit prior to planned left breast lumpectomy and sentinel lymph node biopsy.   Patient is a pleasant 70-year-old female patient of Dr. Russo seen in the breast multidisciplinary clinic for a new diagnosis of left breast cancer. The patient was recently able to feel a mass on breast self-exam in the upper outer quadrant of the left breast. She has not gone for routine screening. She presented to Dr. Russo and was referred to the breast center for further evaluation. I personally reviewed all her imaging and biopsy reports. Mammogram revealed a spiculated 2.5 cm mass in the upper outer quadrant of the left breast. Ultrasound confirmed a hypoechoic mass in the same area measuring 2.2 cm. 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 Subsequently bilateral breast MRI has been performed indicating a 2.3 cm mass at the area of the known malignancy and no other breast lesions. There are noted to be some mildly enlarged lymph nodes in the axilla on the left up to 1.2 cm. She has no previous history of breast disease or breast biopsy. No pain or change in her overall health. No nipple discharge or skin changes.  She was temporarily on letrozole but was unable to tolerate this due to side effects.  Past Medical History  Diagnosis Date  . Lupus   . Hypertension   . Breast cancer 09/14/11    left breast =invasive ductal ca dx  . Osteoporosis   . Hx of migraines   . Thyroglossal duct cyst    Past Surgical History  Procedure Date  . Tubal ligation   . Cesarean section     x2   Current Outpatient Prescriptions  Medication Sig Dispense Refill  . lisinopril (PRINIVIL,ZESTRIL) 10 MG tablet Take 10 mg by mouth daily.       . predniSONE (DELTASONE) 1  MG tablet Take 1 mg by mouth as needed.         Allergies  Allergen Reactions  . Codeine Anaphylaxis  . Erythromycin     Patient reports reaction to ALL antibiotics  . Letrozole     Made the pt's muscles and bones ache  . Penicillins   . Penicillins Cross Reactors     Review of Systems  Constitutional: Negative.   Respiratory: Negative.   Cardiovascular: Negative.   Gastrointestinal: Negative.        Objective:   Physical Exam General: Alert, well-developed Caucasian female, in no distress Skin: Warm and dry without rash or infection. HEENT: No palpable masses or thyromegaly. Sclera nonicteric. Pupils equal round and reactive. Oropharynx clear. Breasts: Again noted, unchanged since October, is a soft, discrete, freely movable, approximately 3 cm mass in the upper outer quadrant of the left breast in the area the tail of Spence. Again no palpable adenopathy. Lymph nodes: No cervical, supraclavicular, or inguinal nodes palpable. Lungs: Breath sounds clear and equal without increased work of breathing Cardiovascular: Regular rate and rhythm without murmur. No JVD or edema. Peripheral pulses intact. Abdomen: Nondistended. Soft and nontender. No masses palpable. No organomegaly. No palpable hernias. Extremities: No edema or joint swelling or deformity. No chronic venous stasis changes. Neurologic: Alert and fully oriented. Gait normal.     Assessment:     Cancer of   the left breast. We reviewed our plan for left breast lumpectomy and sentinel lymph node biopsy. We discussed the nature of the surgery and recovery as well as risks of bleeding, infection, possible need for further surgery based on final pathology.    Plan:     For left breast lumpectomy and left axillary sentinel lymph node biopsy.      

## 2011-12-31 NOTE — Anesthesia Postprocedure Evaluation (Signed)
Anesthesia Post Note  Patient: Tammie Carter  Procedure(s) Performed:  BREAST LUMPECTOMY WITH SENTINEL LYMPH NODE BX - left breast lumpectomy & sentinal lymph node biopsy  Anesthesia type: General  Patient location: PACU  Post pain: Pain level controlled  Post assessment: Patient's Cardiovascular Status Stable  Last Vitals:  Filed Vitals:   12/31/11 1015  BP: 133/73  Pulse: 88  Temp: 36.5 C  Resp: 16    Post vital signs: Reviewed and stable  Level of consciousness: alert  Complications: No apparent anesthesia complications

## 2011-12-31 NOTE — Transfer of Care (Signed)
Immediate Anesthesia Transfer of Care Note  Patient: Tammie Carter  Procedure(s) Performed:  BREAST LUMPECTOMY WITH SENTINEL LYMPH NODE BX - left breast lumpectomy & sentinal lymph node biopsy  Patient Location: PACU  Anesthesia Type: General  Level of Consciousness: awake, alert , oriented and patient cooperative  Airway & Oxygen Therapy: Patient Spontanous Breathing and Patient connected to face mask oxygen  Post-op Assessment: Report given to PACU RN, Post -op Vital signs reviewed and stable and Patient moving all extremities X 4  Post vital signs: Reviewed and stable Filed Vitals:   12/31/11 0740  BP: 139/82  Pulse: 74  Temp:   Resp: 16    Complications: No apparent anesthesia complications

## 2012-01-04 ENCOUNTER — Telehealth (INDEPENDENT_AMBULATORY_CARE_PROVIDER_SITE_OTHER): Payer: Self-pay | Admitting: General Surgery

## 2012-01-04 NOTE — Telephone Encounter (Signed)
Called pt and discussed path 

## 2012-01-10 ENCOUNTER — Encounter: Payer: Self-pay | Admitting: Radiation Oncology

## 2012-01-10 NOTE — Progress Notes (Signed)
71 year old female. Married. 3 of 4 children living.   Patient noted mass along the upper outer quadrant of the left breast in September 2012. Mammography found 2.6 cm spiculated mass within the upper outer quadrant. An ultrasound followed by an ultrasound guided biopsy on 09/14/2011 was diagnostic for invasive ductal carcinoma strongly ER, PR positive with a low proliferation marker of 4%. Breast MR on 09/17/11 showed a 2.3x1.8x2.3 cm macro lobulated mass within the upper outer left breast. Patient seen by Dr. Dayton Scrape 09/22/2011 in [facility]. Left breast lumpectomy with sentinel lymph node biopsy done 12/31/2011. Pathology of lumpectomy revealed invasive grade I ductal carcinoma spanning 2.2 cm associated with low grade ductal carcinoma in situ. Second focus of low grade ductal carcinoma in situ present measuring 0.3 cm. Margins of excision are negative. One lymph node positive for metastatic ductal carcinoma.  Allergic to several antibiotics No indication of a pacemaker No hx of radiation therapy

## 2012-01-11 ENCOUNTER — Ambulatory Visit
Admission: RE | Admit: 2012-01-11 | Discharge: 2012-01-11 | Disposition: A | Discharge status: Home / Self Care | Payer: Medicare Other | Source: Ambulatory Visit | Attending: Radiation Oncology | Admitting: Radiation Oncology

## 2012-01-11 ENCOUNTER — Encounter: Payer: Self-pay | Admitting: Radiation Oncology

## 2012-01-11 VITALS — BP 177/119 | HR 75 | Temp 98.1°F | Resp 20 | Ht 60.0 in | Wt 111.0 lb

## 2012-01-11 DIAGNOSIS — C50419 Malignant neoplasm of upper-outer quadrant of unspecified female breast: Secondary | ICD-10-CM

## 2012-01-11 DIAGNOSIS — C50919 Malignant neoplasm of unspecified site of unspecified female breast: Secondary | ICD-10-CM

## 2012-01-11 NOTE — Progress Notes (Signed)
Pt has not taken BP med this morning; states she "does not feel nervous".  Pt c/o pain, discomfort in L axilla and just above nipple L breast.

## 2012-01-11 NOTE — Progress Notes (Signed)
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 I first saw her in consultation on 09/22/2011. She presented with clinical stage TII N0 invasive ductal carcinoma of the left breast. She noted a mass along the upper outer quadrant of the left breast which on ultrasound, at 2:00, measured 2.2 x 1.9 x 2.1 cm, 12 cm from the nipple. Her tumor was strongly ER/PR positive with a low proliferation marker 4%. Breast MRI also showed several lymph nodes within the left axilla with slightly thickened cortices. She has a history of "lupus" for which he takes 1 mg of prednisone a day. I had a concern about whether or not she would be a candidate for radiation therapy, spoke with Dr. Timothy Lasso who felt that if her C. reactive protein and sedimentation rate were within normal limits, that she would not be considered is having active lupus. No history of a vasculitis. She delayed her definitive surgery to switch insurance plans. She is placed on letrozole by Dr. Darnelle Catalan and she took this from November 8 through December 13. She states that this was poorly tolerated because of insomnia, heartburn, and musculoskeletal discomfort. She want to have definitive surgery with Dr. Johna Sheriff on January 11. She is found to have a 2.2 cm invasive low-grade ductal carcinoma associated with a low-grade DCIS. A second focus of low-grade DCIS was present measuring 0.3 cm. Margins were negative, but close with the closest margins being 0.12 cm for invasive and in situ disease. There was no LV I. However, a single sentinel lymph node contained metastatic disease and there was extracapsular tumor extension. The tumor was strongly ER positive at 99% and PR positive at 85%. HER-2 neu was not amplified. She does report mild axillary discomfort since her  surgery. She is scheduled to see Dr. Darnelle Catalan for discussion of adjuvant therapy on February 14.  Physical examination: Alert and oriented. VS BP 177/119 (did not take blood pressure medication this morning) pulse 75, temp 98.1, respiratory rate 22 Head and neck examination: Grossly nonfocal. Nodes: There is no palpable cervical, supraclavicular, or axillary lymphadenopathy. There is a 2.5-3.0 cm lymphocele or seroma within the left axilla superior to her sentinel lymph node biopsy wound which is healing well. This is tender to palpation. Chest: Lungs clear. Breasts: There is a poor cystectomy wound along the upper outer aspect of left breast from 1 to 2:30. The wound is healing well. There is erythema surrounding the nipple areolar complex, presumably from the sentinel lymph node biopsy isotope tracer. No masses are appreciated. Right breast without masses or lesions. Abdomen without organomegaly. Extremities without edema.  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 Based on the Z- 11 trial there is no need to proceed with a completion axillary node dissection. She is a candidate for breast and nodal irradiation. I anticipate treating her with "high breast tangents" to cover the axilla. She does not to be a candidate for deep inspiration and breath-hold to avoid cardiac irradiation since she cannot hold her breath for more than 10 and 15 seconds, presumably from her previous smoking history. However, I think these can safely avoid the heart since she does have a lateral breast primary. I discussed the potential acute and toxicities of radiation therapy and she wishes to proceed as outlined.  Consent was signed today. She'll see Dr. Darnelle Catalan of February 14 and return here for treatment planning on February 18 unless Dr. Darnelle Catalan feels that she would benefit from chemotherapy. She will be offered adjuvant hormone therapy following completion of radiation therapy.  45  minutes was spent face-to-face with the patient, primarily counseling the patient and coordinating her care.

## 2012-01-11 NOTE — Progress Notes (Signed)
Encounter addended by: Ardell Isaacs, RN on: 01/11/2012  4:30 PM<BR>     Documentation filed: Charges VN

## 2012-01-11 NOTE — Progress Notes (Signed)
Please see the Nurse Progress Note in the MD Initial Consult Encounter for this patient. 

## 2012-02-03 ENCOUNTER — Other Ambulatory Visit (HOSPITAL_BASED_OUTPATIENT_CLINIC_OR_DEPARTMENT_OTHER): Payer: Medicare Other | Admitting: Lab

## 2012-02-03 ENCOUNTER — Ambulatory Visit (HOSPITAL_BASED_OUTPATIENT_CLINIC_OR_DEPARTMENT_OTHER): Payer: Medicare Other | Admitting: Oncology

## 2012-02-03 ENCOUNTER — Telehealth: Payer: Self-pay | Admitting: Oncology

## 2012-02-03 VITALS — BP 153/90 | HR 80 | Temp 97.5°F | Ht 60.0 in | Wt 109.9 lb

## 2012-02-03 DIAGNOSIS — C50919 Malignant neoplasm of unspecified site of unspecified female breast: Secondary | ICD-10-CM

## 2012-02-03 LAB — CBC WITH DIFFERENTIAL/PLATELET
BASO%: 0.4 % (ref 0.0–2.0)
LYMPH%: 19 % (ref 14.0–49.7)
MCHC: 34.1 g/dL (ref 31.5–36.0)
MCV: 91.9 fL (ref 79.5–101.0)
MONO%: 5.7 % (ref 0.0–14.0)
Platelets: 289 10*3/uL (ref 145–400)
RBC: 4.07 10*6/uL (ref 3.70–5.45)
WBC: 7.2 10*3/uL (ref 3.9–10.3)

## 2012-02-03 LAB — COMPREHENSIVE METABOLIC PANEL
ALT: 14 U/L (ref 0–35)
AST: 16 U/L (ref 0–37)
Alkaline Phosphatase: 71 U/L (ref 39–117)
Sodium: 142 mEq/L (ref 135–145)
Total Bilirubin: 0.3 mg/dL (ref 0.3–1.2)
Total Protein: 6.3 g/dL (ref 6.0–8.3)

## 2012-02-03 NOTE — Telephone Encounter (Signed)
S/w the pt's husband and he is aware of the April 2013 appts. Pt is aware of the bone density appt

## 2012-02-03 NOTE — Progress Notes (Signed)
ID: Tammie Carter   DOB: 28-Sep-1941  MR#: [id number]  NFA#:[id number]  HISTORY OF PRESENT ILLNESS: Tammie Carter is a 71 year old [place] woman referred by Tammie Carter for evaluation and treatment in the setting of newly diagnosed breast cancer.   The patient herself palpated a mass in her left breast while showering.  She waited a couple of weeks to see if it would go away, and as it did not she brought it to Tammie Carter's attention.  He set her up for bilateral diagnostic mammography which took place at the breast Carter on September 14, 2011.  This showed a spiculated mass in the upper outer quadrant of the left breast measuring 2.6 cm by mammography.  The right breast was unremarkable.  Tammie Carter, who performed that test, was able to palpate a firm, mobile mass in the upper outer quadrant of the left breast measuring by exam approximately 2.5 cm.  There was no palpable left axillary adenopathy.  She proceeded to left breast ultrasound which showed an irregular hypoechoic mass corresponding to the palpable finding and measuring 2.2 cm by ultrasound.  The axilla demonstrated a lymph node with mild focal cortical thickening but no additional abnormalities.   Biopsy was performed the same day and showed ([id number]) an invasive ductal carcinoma, grade 1, 99% estrogen receptor and 85% progesterone receptor positive, the MIB-1 was 4%.  There was no evidence of HER2 amplification.   With this information, the patient was referred to Tammie Carter, and bilateral breast MRIs were obtained September 17, 2011.  In the upper outer quadrant of the left breast, there was a 2.3 cm enhancing, macrolobulated mass with clip artifact.  There were several lymph nodes in the left axilla with slightly thickened cortices; the largest of these measured 1.2 cm.   INTERVAL HISTORY: Tammie Carter with her husband Tammie Carter in their daughter for followup of her breast cancer. Since her last visit here she had her definitive  left lumpectomy and sentinel lymph node sampling under been Tammie Carter LP 12/31/2011. The results are discussed below.  PAST MEDICAL HISTORY: Past Medical History  Diagnosis Date  . Lupus   . Hypertension   . Osteoporosis   . Hx of migraines   . Thyroglossal duct cyst   . Breast cancer 09/14/11    left breast =invasive ductal ca dx  Significant for systemic lupus erythematosus evaluated by Tammie Carter who, in September 2008, obtained an anti-DNA double-stranded antibody which was negative, SSA, SSB, Smith, ribonucleic protein antibody and scleroderma antibodies, all of which were negative.  The antineutrophil cytoplasmic IgG antibody was negative as well.  There was no lupus anticoagulant.  The C-reactive protein was only mildly elevated at 0.8, and there was no detectable M spike by SPEP.  Beta-2 glycoprotein antibodies and anticardiolipin antibodies were negative.  However, the patient had a positive ANA and an elevated sed rate, in addition to the arthritis, muscle pain, fatigue, all of which have improved on prednisone (she was treated initially with hydroxychloroquine with a total body rash).  Other medical problems include hypertension, glucose intolerance while on prednisone, remote history of migraines, osteoporosis, history of tobacco abuse, 30 pack years, the patient quitting 3 years ago, history of thyroglossal duct removal, history of C-section x2.  PAST SURGICAL HISTORY: Past Surgical History  Procedure Date  . Tubal ligation   . Cesarean section     x2  . Breast lumpectomy 12/31/11    L breast, sn bx, node positive, ER/PR+, hER 2 NEG  FAMILY HISTORY Family History  Problem Relation Age of Onset  . Breast cancer Sister 59    now age 75 stage iv  . Cancer Sister     breast  . Heart disease Mother   . Heart disease Father   The patient's father died at the age of 68 from a stroke.  The patient's mother died at the age of 79 from heart problems.  She had 2 brothers and 1  sister, her sister now 21 was diagnosed with breast cancer at the age of 90; unfortunately, she currently has stage IV disease.  There is no other cancer in the family to her knowledge.  GYNECOLOGIC HISTORY:She had menarche age 71, last period age 83.  She never took hormone replacement therapy.  She is GX P4, first pregnancy to term at age 38.  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  Her husband, Jabrea Kallstrom, present Carter, works in Research officer, political party.  Daughter, [name], 76 years old, lives in [place] and she is a Hydrologist.  Dr. [name], goes by [facility], also present Carter, is 54, lives in [place] and is a former Firefighter, currently unemployed.  Daughter, Venessa Wickham, 20, lives in [place].  She is disabled secondary to diabetes and is status post a renal transplant.  The patient's fourth child, a son, committed suicide at some point in the past.   ADVANCED DIRECTIVES:  HEALTH MAINTENANCE: History  Substance Use Topics  . Smoking status: Former Smoker -- 2.0 packs/day for 50 years    Types: Cigarettes    Quit date: 05/09/2011  . Smokeless tobacco: Not on file   Comment: still smokes occass  . Alcohol Use: No     Colonoscopy: never  PAP: none in 2 decades  Bone density: osteoporosis by bone density 2010  Lipid panel:  @allergies @  Current Outpatient Prescriptions  Medication Sig Dispense Refill  . lisinopril (PRINIVIL,ZESTRIL) 10 MG tablet Take 10 mg by mouth daily.       . predniSONE (DELTASONE) 1 MG tablet Take 1 mg by mouth as needed.          REVIEW OF SYSTEMS: She did well with the surgery, except that she had nausea and vomiting from the pain medication. She stopped that rather quickly. She's feeling a little bit more tired than her usual. I have encouraged her to start a walking program. She has discomfort in the medial aspect of the left upper extremity and left axilla. She understands this is going to be possibly permanent  secondary to nerve trauma from the surgery, which is unavoidable. Otherwise a detailed review of systems was stable and she has had no unusual bleeding, fever, pain, or other symptoms.  OBJECTIVE: Filed Vitals:   02/03/12 0931  BP: 153/90  Pulse: 80  Temp: 97.5 F (36.4 C)     Body mass index is 21.46 kg/(m^2).    ECOG FS: 1  Sclerae unicteric Oropharynx clear No peripheral adenopathy Lungs no rales or rhonchi Heart regular rate and rhythm Abd benign MSK no focal spinal tenderness, no peripheral edema Neuro: nonfoca Breasts: Right breast is unremarkable; left breast is status post recent lumpectomy and axillary lymph node sampling. The incisions are healing very nicely. There is minimal fluid accumulation under the left breast scar. There is no erythema, dehiscence, or other skin changes.  LAB RESULTS: Lab Results  Component Value Date   WBC 7.2 02/03/2012   NEUTROABS 5.3 02/03/2012   HGB 12.8 02/03/2012  HCT 37.5 02/03/2012   MCV 91.9 02/03/2012   PLT 289 02/03/2012      Chemistry      Component Value Date/Time   NA 141 12/30/2011 1100   K 4.7 12/30/2011 1100   CL 105 12/30/2011 1100   CO2 26 12/30/2011 1100   BUN 18 12/30/2011 1100   CREATININE 1.17* 12/30/2011 1100      Component Value Date/Time   CALCIUM 9.8 12/30/2011 1100   ALKPHOS 71 09/22/2011 1220   AST 17 09/22/2011 1220   ALT 13 09/22/2011 1220   BILITOT 0.3 09/22/2011 1220       No results found for this basename: INR:1;PROTIME:1 in the last 168 hours  No results found for this basename: UACOL:1,UAPR:1,USPG:1,UPH:1,UTP:1,UGL:1,UKET:1,UBIL:1,UHGB:1,UNIT:1,UROB:1,ULEU:1,UEPI:1,UWBC:1,URBC:1,UBAC:1,CAST:1,CRYS:1,UCOM:1,BILUA:1 in the last 72 hours   STUDIES: No new results found.  ASSESSMENT: 71 year old [place] woman status post left lumpectomy and sentinel lymph node biopsy 12/31/2011 for a T2 N1, stage IIb invasive ductal carcinoma, grade 1, strongly estrogen and progesterone receptor positive with a very  low MIB-1 (4%) and no HER2/neu positivity.   PLAN: The adjuvant! Program would require a risk of recurrence in the 27% range. She can decrease this by about 8% by taking an aromatase inhibitor for 5 years, which is my recommendation. Chemotherapy might reduce her risk another 1% or so. This is not recommended in her case largely because of her multiple comorbid problems, but also because estrogen receptor positive low-grade tumors like her stent to get minimal if any benefit from chemotherapy.  Accordingly she will proceed to radiation. I anticipate she will complete her radiation treatments in mid April. She will see me again late April. We may try to go back to letrozole, although she tolerated that poorly initially. Alternatively we could move to anastrozole. I am requesting a bone density through Dr. Ferd Hibbs office before that visit. Note that she has had all her teeth pulled. I think he would be a good candidate for Reclastt. We will discuss that at well at the at the next visit.   Alezander Dimaano C    02/03/2012

## 2012-02-04 ENCOUNTER — Telehealth: Payer: Self-pay | Admitting: Oncology

## 2012-02-04 NOTE — Telephone Encounter (Signed)
S/w the pt and she is aware of her April 2013 appts. Per pt she will call to set up her own bone density appt

## 2012-02-07 ENCOUNTER — Ambulatory Visit
Admission: RE | Admit: 2012-02-07 | Discharge: 2012-02-07 | Disposition: A | Discharge status: Home / Self Care | Payer: Medicare Other | Source: Ambulatory Visit | Attending: Radiation Oncology | Admitting: Radiation Oncology

## 2012-02-07 DIAGNOSIS — C50419 Malignant neoplasm of upper-outer quadrant of unspecified female breast: Secondary | ICD-10-CM | POA: Insufficient documentation

## 2012-02-07 DIAGNOSIS — C50919 Malignant neoplasm of unspecified site of unspecified female breast: Secondary | ICD-10-CM

## 2012-02-07 NOTE — Progress Notes (Signed)
Simulation/treatment planning note:  The patient was taken to the CT simulator. A custom neck wall was constructed with a breast board for immobilization. Her left partial mastectomy scar was marked with a radiopaque wire. Her borders were marked with radiopaque wires for high breast tangents. She was then scanned. An isocenter was chosen. She was set up to medial and lateral left breast tangents. Of note is that she could not perform breath-hold. Her left breast tumor bed, guided by her clips, was contoured along with her left partial mastectomy scar. 2 separate multileaf collimators were designed to shield normal surrounding structures. I prescribing 4860 cGy in 27 sessions utilizing mixed energies. An isodose plan and dosimetry are requested. This be followed by left breast boost for a further 1440 cGy in 8 sessions utilizing 18 MEV electrons.

## 2012-02-07 NOTE — Progress Notes (Signed)
Met with patient to discuss RO billing. ° °Dx: 174.9 Female Breast, NOS (excludes Skin of breast T-173.5) ° °Attending Rad: Dr. Murray ° °Rad Tx: [id number] Extrl Beam °

## 2012-02-09 ENCOUNTER — Encounter (INDEPENDENT_AMBULATORY_CARE_PROVIDER_SITE_OTHER): Payer: Self-pay | Admitting: General Surgery

## 2012-02-09 ENCOUNTER — Ambulatory Visit (INDEPENDENT_AMBULATORY_CARE_PROVIDER_SITE_OTHER): Payer: Medicare Other | Admitting: General Surgery

## 2012-02-09 DIAGNOSIS — C50919 Malignant neoplasm of unspecified site of unspecified female breast: Secondary | ICD-10-CM

## 2012-02-09 NOTE — Progress Notes (Signed)
History: Patient returns for what is actually her first postoperative visit now 6 weeks following left breast lumpectomy and sentinel lymph node biopsy for stage IIb invasive cancer of the left breast. She will be starting radiation next week. Plan is for hormonal treatment following this. She has been fatigued since surgery. No specific complaints related to her breast or arm.  Exam: Gen.: Looks a little rundown but no distress Lymph nodes: No cervical, subclavicular, or axillary nodes palpable Breasts: Healing incision in the upper outer quadrant left breast without complication and no other abnormalities  Assessment and plan: Doing well following initial surgical treatment for stage IIB cancer the left breast. She is to begin radiation and hormonal treatment. I will see her back in 6 months.

## 2012-02-14 ENCOUNTER — Encounter: Payer: Self-pay | Admitting: Radiation Oncology

## 2012-02-14 ENCOUNTER — Ambulatory Visit: Payer: Medicare Other

## 2012-02-14 ENCOUNTER — Ambulatory Visit
Admission: RE | Admit: 2012-02-14 | Discharge: 2012-02-14 | Disposition: A | Discharge status: Home / Self Care | Payer: Medicare Other | Source: Ambulatory Visit | Attending: Radiation Oncology | Admitting: Radiation Oncology

## 2012-02-14 NOTE — Progress Notes (Signed)
Simulation verification note  The patient underwent similar to verification for treatment to her left breast. Her isocenter is in good position. The multileaf collimators contoured the intended treatment volume appropriately.

## 2012-02-15 ENCOUNTER — Ambulatory Visit
Admission: RE | Admit: 2012-02-15 | Discharge: 2012-02-15 | Disposition: A | Discharge status: Home / Self Care | Payer: Medicare Other | Source: Ambulatory Visit | Attending: Radiation Oncology | Admitting: Radiation Oncology

## 2012-02-15 VITALS — Wt 107.8 lb

## 2012-02-15 DIAGNOSIS — C50419 Malignant neoplasm of upper-outer quadrant of unspecified female breast: Secondary | ICD-10-CM

## 2012-02-16 ENCOUNTER — Ambulatory Visit
Admission: RE | Admit: 2012-02-16 | Discharge: 2012-02-16 | Disposition: A | Discharge status: Home / Self Care | Payer: Medicare Other | Source: Ambulatory Visit | Attending: Radiation Oncology | Admitting: Radiation Oncology

## 2012-02-16 MED ORDER — RADIAPLEXRX EX GEL
Freq: Once | CUTANEOUS | Status: AC
  Administered 2012-02-16: 17:00:00 via TOPICAL

## 2012-02-16 MED ORDER — ALRA NON-METALLIC DEODORANT (RAD-ONC)
1.0000 "application " | Freq: Once | TOPICAL | Status: AC
  Administered 2012-02-16: 1 via TOPICAL

## 2012-02-16 NOTE — Progress Notes (Signed)
02/15/2012 1418 Received patient in the clinic today for post sim education with Sam, RN. Patient is alert and oriented to person, place, and time. No distress noted. Steady gait noted. Pleasant affect noted. Patient denies pain at this time. Oriented patient to staff and routine of the clinic. Provided patient with RADIATION THERAPY AND YOU handbook. Reviewed pertinent information within the handbook with the patient. Provided patient with Radiaplex and Alra then reviewed use of products. Reviewed potential side effects and management. Answered all questions. Provided patient with this writers contact information and encouraged to call with needs. Patient verbalized understanding of all things reviewed.

## 2012-02-17 ENCOUNTER — Encounter: Payer: Self-pay | Admitting: Radiation Oncology

## 2012-02-17 ENCOUNTER — Ambulatory Visit
Admission: RE | Admit: 2012-02-17 | Discharge: 2012-02-17 | Disposition: A | Discharge status: Home / Self Care | Payer: Medicare Other | Source: Ambulatory Visit | Attending: Radiation Oncology | Admitting: Radiation Oncology

## 2012-02-17 NOTE — Progress Notes (Signed)
Weekly Management Note:  Site:L Breast Current Dose:  540  cGy Projected Dose: 6300  cGy  Narrative: The patient is seen today for routine under treatment assessment. CBCT/MVCT images/port films were reviewed. The chart was reviewed.   No complaints today. She is using Radioplex gel.  Physical Examination: There were no vitals filed for this visit..  Weight:  . No skin changes.  Impression: Tolerating radiation therapy well.  Plan: Continue radiation therapy as planned.

## 2012-02-18 ENCOUNTER — Ambulatory Visit
Admission: RE | Admit: 2012-02-18 | Discharge: 2012-02-18 | Disposition: A | Discharge status: Home / Self Care | Payer: Medicare Other | Source: Ambulatory Visit | Attending: Radiation Oncology | Admitting: Radiation Oncology

## 2012-02-21 ENCOUNTER — Ambulatory Visit
Admission: RE | Admit: 2012-02-21 | Discharge: 2012-02-21 | Disposition: A | Discharge status: Home / Self Care | Payer: Medicare Other | Source: Ambulatory Visit | Attending: Radiation Oncology | Admitting: Radiation Oncology

## 2012-02-21 ENCOUNTER — Encounter: Payer: Self-pay | Admitting: Radiation Oncology

## 2012-02-21 VITALS — BP 172/87 | HR 75 | Resp 18 | Wt 108.5 lb

## 2012-02-21 DIAGNOSIS — C50919 Malignant neoplasm of unspecified site of unspecified female breast: Secondary | ICD-10-CM

## 2012-02-21 NOTE — Progress Notes (Signed)
Patient presented to the clinic today unaccompanied for under treat visit with Dr.Murray. Patient is alert and oriented to person, place, and time. No distress noted. Steady gait noted. Pleasant affect noted. Patient denies pain at this time. Blood pressure elevated. Patient state,"I didn't take my blood pressure medicine this morning; i just can't remember." No complaints. Using radiaplex and alra as directed. Reported all findings to Dr. Murray.  

## 2012-02-21 NOTE — Progress Notes (Signed)
Weekly Management Note:  Site:L Breast Current Dose:  900  cGy Projected Dose: 6300  cGy  Narrative: The patient is seen today for routine under treatment assessment. CBCT/MVCT images/port films were reviewed. The chart was reviewed.   She uses Biafine cream. No complaints today.  Physical Examination:  Filed Vitals:   02/21/12 1419  BP: 172/87  Pulse: 75  Resp: 18  .  Weight: 108 lb 8 oz (49.215 kg). No significant skin changes.  Impression: Tolerating radiation therapy well.  Plan: Continue radiation therapy as planned.

## 2012-02-22 ENCOUNTER — Ambulatory Visit
Admission: RE | Admit: 2012-02-22 | Discharge: 2012-02-22 | Disposition: A | Discharge status: Home / Self Care | Payer: Medicare Other | Source: Ambulatory Visit | Attending: Radiation Oncology | Admitting: Radiation Oncology

## 2012-02-23 ENCOUNTER — Ambulatory Visit
Admission: RE | Admit: 2012-02-23 | Discharge: 2012-02-23 | Disposition: A | Discharge status: Home / Self Care | Payer: Medicare Other | Source: Ambulatory Visit | Attending: Radiation Oncology | Admitting: Radiation Oncology

## 2012-02-24 ENCOUNTER — Ambulatory Visit
Admission: RE | Admit: 2012-02-24 | Discharge: 2012-02-24 | Disposition: A | Discharge status: Home / Self Care | Payer: Medicare Other | Source: Ambulatory Visit | Attending: Radiation Oncology | Admitting: Radiation Oncology

## 2012-02-25 ENCOUNTER — Ambulatory Visit
Admission: RE | Admit: 2012-02-25 | Discharge: 2012-02-25 | Disposition: A | Discharge status: Home / Self Care | Payer: Medicare Other | Source: Ambulatory Visit | Attending: Radiation Oncology | Admitting: Radiation Oncology

## 2012-02-28 ENCOUNTER — Ambulatory Visit
Admission: RE | Admit: 2012-02-28 | Discharge: 2012-02-28 | Disposition: A | Discharge status: Home / Self Care | Payer: Medicare Other | Source: Ambulatory Visit | Attending: Radiation Oncology | Admitting: Radiation Oncology

## 2012-02-28 ENCOUNTER — Encounter: Payer: Self-pay | Admitting: Radiation Oncology

## 2012-02-28 VITALS — Wt 108.2 lb

## 2012-02-28 DIAGNOSIS — C50919 Malignant neoplasm of unspecified site of unspecified female breast: Secondary | ICD-10-CM

## 2012-02-28 NOTE — Progress Notes (Signed)
Weekly Management Note:  Site:L Breast Current Dose:  1800  cGy Projected Dose: 6300  cGy  Narrative: The patient is seen today for routine under treatment assessment. CBCT/MVCT images/port films were reviewed. The chart was reviewed.   She is without complaints today. She is using Biafine cream when necessary.  Physical Examination: There were no vitals filed for this visit..  Weight: 108 lb 3.2 oz (49.079 kg). There is slight erythema the skin within her treatment field with no areas of desquamation.  Impression: Tolerating radiation therapy well.  Plan: Continue radiation therapy as planned.

## 2012-02-28 NOTE — Progress Notes (Signed)
Pt has no c/o today 

## 2012-02-29 ENCOUNTER — Ambulatory Visit
Admission: RE | Admit: 2012-02-29 | Discharge: 2012-02-29 | Disposition: A | Discharge status: Home / Self Care | Payer: Medicare Other | Source: Ambulatory Visit | Attending: Radiation Oncology | Admitting: Radiation Oncology

## 2012-03-01 ENCOUNTER — Ambulatory Visit
Admission: RE | Admit: 2012-03-01 | Discharge: 2012-03-01 | Disposition: A | Discharge status: Home / Self Care | Payer: Medicare Other | Source: Ambulatory Visit | Attending: Radiation Oncology | Admitting: Radiation Oncology

## 2012-03-02 ENCOUNTER — Ambulatory Visit
Admission: RE | Admit: 2012-03-02 | Discharge: 2012-03-02 | Disposition: A | Discharge status: Home / Self Care | Payer: Medicare Other | Source: Ambulatory Visit | Attending: Radiation Oncology | Admitting: Radiation Oncology

## 2012-03-03 ENCOUNTER — Ambulatory Visit
Admission: RE | Admit: 2012-03-03 | Discharge: 2012-03-03 | Disposition: A | Discharge status: Home / Self Care | Payer: Medicare Other | Source: Ambulatory Visit | Attending: Radiation Oncology | Admitting: Radiation Oncology

## 2012-03-06 ENCOUNTER — Ambulatory Visit
Admission: RE | Admit: 2012-03-06 | Discharge: 2012-03-06 | Disposition: A | Discharge status: Home / Self Care | Payer: Medicare Other | Source: Ambulatory Visit | Attending: Radiation Oncology | Admitting: Radiation Oncology

## 2012-03-06 ENCOUNTER — Encounter: Payer: Self-pay | Admitting: Radiation Oncology

## 2012-03-06 VITALS — BP 174/88 | HR 76 | Resp 18 | Wt 110.4 lb

## 2012-03-06 DIAGNOSIS — C50419 Malignant neoplasm of upper-outer quadrant of unspecified female breast: Secondary | ICD-10-CM

## 2012-03-06 NOTE — Progress Notes (Signed)
Patient presents to the clinic today unaccompanied for an under treat visit with Dr. Dayton Scrape. Patient is alert and oriented to person, place, and time. No distress noted. Steady gait noted. Pleasant affect noted. Patient denies pain at this time. Faint hyperpigmentation of left/treat breast noted. Patient reports working in her yard over the weekend. Patient has 19 flower beds. Patient has no complaints at this time. Patient reports using radiaplex and alra as directed. Reported all findings to Dr. Dayton Scrape.

## 2012-03-06 NOTE — Progress Notes (Signed)
Weekly Management Note:  Site:L Breast Current Dose:  2700  cGy Projected Dose: 6300  cGy  Narrative: The patient is seen today for routine under treatment assessment. CBCT/MVCT images/port films were reviewed. The chart was reviewed.   She is without complaints today. She uses Radioplex gel when necessary.  Physical Examination:  Filed Vitals:   03/06/12 1357  BP: 174/88  Pulse: 76  Resp: 18  .  Weight: 110 lb 6.4 oz (50.077 kg). Faint erythema the skin with no areas of desquamation.  Impression: Tolerating radiation therapy well.  Plan: Continue radiation therapy as planned.

## 2012-03-07 ENCOUNTER — Ambulatory Visit
Admission: RE | Admit: 2012-03-07 | Discharge: 2012-03-07 | Disposition: A | Discharge status: Home / Self Care | Payer: Medicare Other | Source: Ambulatory Visit | Attending: Radiation Oncology | Admitting: Radiation Oncology

## 2012-03-08 ENCOUNTER — Ambulatory Visit
Admission: RE | Admit: 2012-03-08 | Discharge: 2012-03-08 | Disposition: A | Discharge status: Home / Self Care | Payer: Medicare Other | Source: Ambulatory Visit | Attending: Radiation Oncology | Admitting: Radiation Oncology

## 2012-03-09 ENCOUNTER — Ambulatory Visit
Admission: RE | Admit: 2012-03-09 | Discharge: 2012-03-09 | Disposition: A | Discharge status: Home / Self Care | Payer: Medicare Other | Source: Ambulatory Visit | Attending: Radiation Oncology | Admitting: Radiation Oncology

## 2012-03-10 ENCOUNTER — Ambulatory Visit
Admission: RE | Admit: 2012-03-10 | Discharge: 2012-03-10 | Disposition: A | Discharge status: Home / Self Care | Payer: Medicare Other | Source: Ambulatory Visit | Attending: Radiation Oncology | Admitting: Radiation Oncology

## 2012-03-13 ENCOUNTER — Encounter: Payer: Self-pay | Admitting: Radiation Oncology

## 2012-03-13 ENCOUNTER — Ambulatory Visit
Admission: RE | Admit: 2012-03-13 | Discharge: 2012-03-13 | Disposition: A | Discharge status: Home / Self Care | Payer: Medicare Other | Source: Ambulatory Visit | Attending: Radiation Oncology | Admitting: Radiation Oncology

## 2012-03-13 VITALS — Wt 109.0 lb

## 2012-03-13 DIAGNOSIS — C50919 Malignant neoplasm of unspecified site of unspecified female breast: Secondary | ICD-10-CM

## 2012-03-13 DIAGNOSIS — C50419 Malignant neoplasm of upper-outer quadrant of unspecified female breast: Secondary | ICD-10-CM

## 2012-03-13 MED ORDER — BIAFINE EX EMUL
Freq: Two times a day (BID) | CUTANEOUS | Status: DC
  Administered 2012-03-13: 15:00:00 via TOPICAL

## 2012-03-13 NOTE — Progress Notes (Signed)
Weekly Management Note:  Site:L Breast Current Dose:  3600  cGy Projected Dose: 6300  cGy  Narrative: The patient is seen today for routine under treatment assessment. CBCT/MVCT images/port films were reviewed. The chart was reviewed.   She complains of more pruritus, particularly in the upper inner quadrant of the left breast. She has been using Radioplex gel.  Physical Examination: There were no vitals filed for this visit..  Weight: 109 lb (49.442 kg). There is a papular erythema along the upper inner quadrant of the left breast. There is moderate erythema along the left inframammary fold but no areas of desquamation.  Impression: Tolerating radiation therapy well. 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  Plan: Continue radiation therapy as planned. We'll start her on Biafine cream to use when necessary.

## 2012-03-13 NOTE — Progress Notes (Signed)
Pt states L breast "hurts a little". Takes Tylenol prn w/good relief. Gave pt Biafine for radiation dermatitis which she states "itches".

## 2012-03-14 ENCOUNTER — Ambulatory Visit
Admission: RE | Admit: 2012-03-14 | Discharge: 2012-03-14 | Disposition: A | Discharge status: Home / Self Care | Payer: Medicare Other | Source: Ambulatory Visit | Attending: Radiation Oncology | Admitting: Radiation Oncology

## 2012-03-15 ENCOUNTER — Ambulatory Visit
Admission: RE | Admit: 2012-03-15 | Discharge: 2012-03-15 | Disposition: A | Discharge status: Home / Self Care | Payer: Medicare Other | Source: Ambulatory Visit | Attending: Radiation Oncology | Admitting: Radiation Oncology

## 2012-03-16 ENCOUNTER — Ambulatory Visit
Admission: RE | Admit: 2012-03-16 | Discharge: 2012-03-16 | Disposition: A | Discharge status: Home / Self Care | Payer: Medicare Other | Source: Ambulatory Visit | Attending: Radiation Oncology | Admitting: Radiation Oncology

## 2012-03-16 ENCOUNTER — Encounter: Payer: Self-pay | Admitting: Radiation Oncology

## 2012-03-16 NOTE — Progress Notes (Signed)
Electron beam simulation note:  The patient underwent virtual simulation before her left breast boost. She was set up en face to her left breast. One custom block is constructed to shield normal surrounding structures. A special port plan is requested. I prescribing 1440 cGy in 8 sessions utilizing 18 MEV electrons. Electron beam energy is chosen based on the depth of her tumor bed as seen on her CT scan.

## 2012-03-17 ENCOUNTER — Ambulatory Visit
Admission: RE | Admit: 2012-03-17 | Discharge: 2012-03-17 | Disposition: A | Discharge status: Home / Self Care | Payer: Medicare Other | Source: Ambulatory Visit | Attending: Radiation Oncology | Admitting: Radiation Oncology

## 2012-03-20 ENCOUNTER — Ambulatory Visit
Admission: RE | Admit: 2012-03-20 | Discharge: 2012-03-20 | Disposition: A | Discharge status: Home / Self Care | Payer: Medicare Other | Source: Ambulatory Visit | Attending: Radiation Oncology | Admitting: Radiation Oncology

## 2012-03-20 ENCOUNTER — Encounter: Payer: Self-pay | Admitting: Radiation Oncology

## 2012-03-20 VITALS — BP 155/85 | HR 73 | Resp 18 | Wt 108.0 lb

## 2012-03-20 DIAGNOSIS — C50919 Malignant neoplasm of unspecified site of unspecified female breast: Secondary | ICD-10-CM

## 2012-03-20 DIAGNOSIS — C50419 Malignant neoplasm of upper-outer quadrant of unspecified female breast: Secondary | ICD-10-CM

## 2012-03-20 MED ORDER — BIAFINE EX EMUL
Freq: Every day | CUTANEOUS | Status: DC
  Administered 2012-03-20: 14:00:00 via TOPICAL

## 2012-03-20 NOTE — Progress Notes (Signed)
Patient presents to the clinic today unaccompanied for under treat visit with Dr. Basilio Cairo. Patient is alert and oriented to person, place, and time. No distress noted. Steady gait noted. Pleasant affect noted. Patient denies pain at this time. Patient reports her left breast is sore but, denies pain. Patient reports using Biafine as directed. Patient states the biafine provides relief from itching as well. Provided patient with an additional tube of biafine. Patient has no other complaints. Reported all findings to Dr. Basilio Cairo.

## 2012-03-20 NOTE — Progress Notes (Signed)
   Weekly Management Note on the left breast cancer Current Dose:   4500 cGy  Projected Dose: 6300 cGy   Narrative:  The patient presents for routine under treatment assessment.  CBCT/MVCT images/Port film x-rays were reviewed.  The chart was checked. She is doing well without complaints other than some soreness in her left breast..  Physical Findings: Weight: 108 lb (48.988 kg). She is bright erythema throughout her left breast. There are some areas of dry desquamation  Impression:  The patient is tolerating radiotherapy.  Plan:  Continue radiotherapy as planned. Will continue to use Biafine

## 2012-03-21 ENCOUNTER — Ambulatory Visit
Admission: RE | Admit: 2012-03-21 | Discharge: 2012-03-21 | Disposition: A | Discharge status: Home / Self Care | Payer: Medicare Other | Source: Ambulatory Visit | Attending: Radiation Oncology | Admitting: Radiation Oncology

## 2012-03-22 ENCOUNTER — Ambulatory Visit
Admission: RE | Admit: 2012-03-22 | Discharge: 2012-03-22 | Disposition: A | Discharge status: Home / Self Care | Payer: Medicare Other | Source: Ambulatory Visit | Attending: Radiation Oncology | Admitting: Radiation Oncology

## 2012-03-23 ENCOUNTER — Ambulatory Visit
Admission: RE | Admit: 2012-03-23 | Discharge: 2012-03-23 | Disposition: A | Discharge status: Home / Self Care | Payer: Medicare Other | Source: Ambulatory Visit | Attending: Radiation Oncology | Admitting: Radiation Oncology

## 2012-03-24 ENCOUNTER — Ambulatory Visit
Admission: RE | Admit: 2012-03-24 | Discharge: 2012-03-24 | Disposition: A | Discharge status: Home / Self Care | Payer: Medicare Other | Source: Ambulatory Visit | Attending: Radiation Oncology | Admitting: Radiation Oncology

## 2012-03-27 ENCOUNTER — Ambulatory Visit
Admission: RE | Admit: 2012-03-27 | Discharge: 2012-03-27 | Disposition: A | Discharge status: Home / Self Care | Payer: Medicare Other | Source: Ambulatory Visit | Attending: Radiation Oncology | Admitting: Radiation Oncology

## 2012-03-27 ENCOUNTER — Encounter: Payer: Self-pay | Admitting: Radiation Oncology

## 2012-03-27 VITALS — Resp 18 | Wt 107.2 lb

## 2012-03-27 DIAGNOSIS — C50919 Malignant neoplasm of unspecified site of unspecified female breast: Secondary | ICD-10-CM

## 2012-03-27 NOTE — Progress Notes (Signed)
Patient presents to the clinic today unaccompanied for under treat visit with Dr. Dayton Scrape. Patient is alert and oriented to person, place, and time. No distress noted. Steady gait noted. Pleasant affect noted. Patient denies pain at this time. Patient reports using Biafine as directed. Hyperpigmentation of left/treat breast without desquamation noted. Nipple of left/treated breast extremely dry. Patient has no other complaints at this time. Reported all findings to Dr. Dayton Scrape.

## 2012-03-27 NOTE — Progress Notes (Signed)
Weekly Management Note:  Site:L Breast Current Dose:  4860  cGy Projected Dose: 6300  cGy  Narrative: The patient is seen today for routine under treatment assessment. CBCT/MVCT images/port films were reviewed. The chart was reviewed.   No new complaints today. She uses Biafine cream when necessary.  Physical Examination:  Filed Vitals:   03/27/12 1344  Resp: 18  .  Weight: 107 lb 3.2 oz (48.626 kg). There is marked erythema the skin, particularly along the inframammary region where there is patchy dry desquamation.  Impression: Tolerating radiation therapy well.  Plan: Continue radiation therapy as planned.

## 2012-03-28 ENCOUNTER — Ambulatory Visit
Admission: RE | Admit: 2012-03-28 | Discharge: 2012-03-28 | Disposition: A | Discharge status: Home / Self Care | Payer: Medicare Other | Source: Ambulatory Visit | Attending: Radiation Oncology | Admitting: Radiation Oncology

## 2012-03-29 ENCOUNTER — Ambulatory Visit
Admission: RE | Admit: 2012-03-29 | Discharge: 2012-03-29 | Disposition: A | Discharge status: Home / Self Care | Payer: Medicare Other | Source: Ambulatory Visit | Attending: Radiation Oncology | Admitting: Radiation Oncology

## 2012-03-30 ENCOUNTER — Ambulatory Visit
Admission: RE | Admit: 2012-03-30 | Discharge: 2012-03-30 | Disposition: A | Discharge status: Home / Self Care | Payer: Medicare Other | Source: Ambulatory Visit | Attending: Radiation Oncology | Admitting: Radiation Oncology

## 2012-03-31 ENCOUNTER — Ambulatory Visit
Admission: RE | Admit: 2012-03-31 | Discharge: 2012-03-31 | Disposition: A | Discharge status: Home / Self Care | Payer: Medicare Other | Source: Ambulatory Visit | Attending: Radiation Oncology | Admitting: Radiation Oncology

## 2012-04-03 ENCOUNTER — Ambulatory Visit
Admission: RE | Admit: 2012-04-03 | Discharge: 2012-04-03 | Disposition: A | Discharge status: Home / Self Care | Payer: Medicare Other | Source: Ambulatory Visit | Attending: Radiation Oncology | Admitting: Radiation Oncology

## 2012-04-03 ENCOUNTER — Encounter: Payer: Self-pay | Admitting: Radiation Oncology

## 2012-04-03 VITALS — Wt 107.3 lb

## 2012-04-03 DIAGNOSIS — C50919 Malignant neoplasm of unspecified site of unspecified female breast: Secondary | ICD-10-CM

## 2012-04-03 NOTE — Progress Notes (Signed)
[facility] Radiation Oncology End of Treatment Note  Name:Tammie Carter  Date: 04/03/2012 AVW:[id number] DOB:1941/09/06   Status:outpatient    CC: Gwen Pounds, MD, MD  Dr. Jaclynn Guarneri. Dr. Marikay Alar Magrinat  REFERRING PHYSICIAN: Dr. 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 18 MEV electron boost to the left breast.                 NARRATIVE:  The patient tolerated treatment well although she developed moderate radiation dermatitis as expected. She was treated with Radioplex gel and then Biafine cream.                          PLAN: Routine followup in one month. Patient instructed to call if questions or worsening complaints in interim.

## 2012-04-03 NOTE — Progress Notes (Signed)
Weekly Management Note:  Site:L Breast Current Dose:  6300  cGy Projected Dose: 6300  cGy  Narrative: The patient is seen today for routine under treatment assessment. CBCT/MVCT images/port films were reviewed. The chart was reviewed.   No new complaints today. She is Biafine cream and also Radioplex gel when necessary.  Physical Examination: There were no vitals filed for this visit..  Weight: 107 lb 4.8 oz (48.671 kg). There is erythema the breast, particularly her upper-outer quadrant electron beam port which has dry desquamation. No areas of moist desquamation.  Impression: Radiation therapy completed  Plan: Followup visit in one month.

## 2012-04-03 NOTE — Progress Notes (Signed)
Pt states she has some "tenderness" in left axilla. She washed windows over weekend. Did not take any OTC for this. Applying Biafine and Radiaplex in tx area. Area hyperpigmented but no moist desquamation noted today. Completed tx today

## 2012-04-17 ENCOUNTER — Ambulatory Visit (HOSPITAL_BASED_OUTPATIENT_CLINIC_OR_DEPARTMENT_OTHER): Payer: Medicare Other | Admitting: Oncology

## 2012-04-17 ENCOUNTER — Other Ambulatory Visit: Payer: Medicare Other | Admitting: Lab

## 2012-04-17 ENCOUNTER — Telehealth: Payer: Self-pay | Admitting: Oncology

## 2012-04-17 VITALS — BP 162/91 | HR 71 | Temp 98.1°F | Ht 60.0 in | Wt 107.7 lb

## 2012-04-17 DIAGNOSIS — C50919 Malignant neoplasm of unspecified site of unspecified female breast: Secondary | ICD-10-CM

## 2012-04-17 DIAGNOSIS — Z170 Estrogen receptor positive status [ER+]: Secondary | ICD-10-CM

## 2012-04-17 MED ORDER — TAMOXIFEN CITRATE 20 MG PO TABS: 20.0000 mg | ORAL_TABLET | Freq: Every day | ORAL | Status: AC

## 2012-04-17 NOTE — Telephone Encounter (Signed)
gve the pt her July 2013 appt calendar °

## 2012-04-17 NOTE — Progress Notes (Signed)
ID: Hettie Holstein   DOB: [birth year]  MR#: [id number]  CSN#:[id number]  HISTORY OF PRESENT ILLNESS: The patient herself palpated a mass in her left breast while showering.  She waited a couple of weeks to see if it would go away, and as it did not she brought it to Dr. Jonny Ruiz Russo's attention.  He set her up for bilateral diagnostic mammography which took place at the breast center on September 14, 2011.  This showed a spiculated mass in the upper outer quadrant of the left breast measuring 2.6 cm by mammography.  The right breast was unremarkable.  Dr. Jean Rosenthal, who performed that test, was able to palpate a firm, mobile mass in the upper outer quadrant of the left breast measuring by exam approximately 2.5 cm.  There was no palpable left axillary adenopathy.  She proceeded to left breast ultrasound which showed an irregular hypoechoic mass corresponding to the palpable finding and measuring 2.2 cm by ultrasound.  The axilla demonstrated a lymph node with mild focal cortical thickening but no additional abnormalities.   Biopsy was performed the same day and showed ([id number]) an invasive ductal carcinoma, grade 1, 99% estrogen receptor and 85% progesterone receptor positive, the MIB-1 was 4%.  There was no evidence of HER2 amplification.   With this information, the patient was referred to Dr. Johna Sheriff, and bilateral breast MRIs were obtained September 17, 2011.  In the upper outer quadrant of the left breast, there was a 2.3 cm enhancing, macrolobulated mass with clip artifact.  There were several lymph nodes in the left axilla with slightly thickened cortices; the largest of these measured 1.2 cm. She undewent definitive local surgery 12/31/2011.  Her subsequent history is as detailed below.  INTERVAL HISTORY: [place] returns today with her husband [name] for followup of her breast cancer. She completed her radiation treatments earlier this month. She had some fatigue and some erythema, no desquamation. She  is now ready to start on anti-estrogen therapy.  REVIEW OF SYSTEMS: She still slightly fatigued but otherwise she is ready to "get into the yard work close ". She doesn't have any hot flashes or vaginal dryness problems at present. She denies any significant arthralgias or myalgias although of course she has a history of lupus and she is still on low-dose prednisone. A detailed review of systems was otherwise noncontributory to  PAST MEDICAL HISTORY: Past Medical History  Diagnosis Date  . Lupus   . Hypertension   . Osteoporosis   . Hx of migraines   . Thyroglossal duct cyst   . Breast cancer 09/14/11    left breast =invasive ductal ca dx  Significant for systemic lupus erythematosus evaluated by Dr. Corliss Skains who, in September 2008, obtained an anti-DNA double-stranded antibody which was negative, SSA, SSB, Smith, ribonucleic protein antibody and scleroderma antibodies, all of which were negative.  The antineutrophil cytoplasmic IgG antibody was negative as well.  There was no lupus anticoagulant.  The C-reactive protein was only mildly elevated at 0.8, and there was no detectable M spike by SPEP.  Beta-2 glycoprotein antibodies and anticardiolipin antibodies were negative.  However, the patient had a positive ANA and an elevated sed rate, in addition to the arthritis, muscle pain, fatigue, all of which have improved on prednisone (she was treated initially with hydroxychloroquine with a total body rash).  Other medical problems include hypertension, glucose intolerance while on prednisone, remote history of migraines, osteoporosis, history of tobacco abuse, 30 pack years, the patient quitting 3 years ago, history  of thyroglossal duct removal, history of C-section x2.  PAST SURGICAL HISTORY: Past Surgical History  Procedure Date  . Tubal ligation   . Cesarean section     x2  . Breast lumpectomy 12/31/11    L breast, sn bx, node positive, ER/PR+, hER 2 NEG    FAMILY HISTORY Family History    Problem Relation Age of Onset  . Breast cancer Sister 35    now age 8 stage iv  . Cancer Sister     breast  . Heart disease Mother   . Heart disease Father   The patient's father died at the age of 73 from a stroke.  The patient's mother died at the age of 60 from heart problems.  She had 2 brothers and 1 sister, her sister now 49 was diagnosed with breast cancer at the age of 70; unfortunately, she currently has stage IV disease.  There is no other cancer in the family to her knowledge.  GYNECOLOGIC HISTORY: She had menarche age 44, last period age 65.  She never took hormone replacement therapy.  She is GX P4, first pregnancy to term at age 56.  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  Her husband, Cecely Rengel, present today, works in Research officer, political party.  Daughter, [name], 71 years old, lives in [place] and she is a Hydrologist.  Daughter. [name], goes by [place],  is 15, lives in [place] and is a former Firefighter, currently unemployed.  Daughter, Mackensi Mahadeo, 33, lives in [place].  She is disabled secondary to diabetes and is status post a renal transplant.  The patient's fourth child, a son, committed suicide at some point in the past.   ADVANCED DIRECTIVES:  HEALTH MAINTENANCE: History  Substance Use Topics  . Smoking status: Former Smoker -- 2.0 packs/day for 50 years    Types: Cigarettes    Quit date: 05/09/2011  . Smokeless tobacco: Not on file   Comment: still smokes occass  . Alcohol Use: No     Colonoscopy:  PAP: remote  Bone density: osteoporosis  Lipid panel:  Allergies  Allergen Reactions  . Codeine Anaphylaxis  . Morphine And Related Shortness Of Breath and Nausea And Vomiting  . Erythromycin     Patient reports reaction to ALL antibiotics  . Letrozole     Made the pt's muscles and bones ache  . Penicillins   . Penicillins Cross Reactors     Current Outpatient Prescriptions  Medication Sig Dispense Refill  .  lisinopril (PRINIVIL,ZESTRIL) 10 MG tablet Take 10 mg by mouth daily.       . non-metallic deodorant Thornton Papas) MISC Apply 1 application topically daily as needed.      . predniSONE (DELTASONE) 1 MG tablet Take 1 mg by mouth as needed.        . Wound Cleansers (RADIAPLEX EX) Apply topically.      . Wound Dressings (BIAFINE EX) Apply topically.        OBJECTIVE: Elderly white woman who appears well Filed Vitals:   04/17/12 1320  BP: 162/91  Pulse: 71  Temp: 98.1 F (36.7 C)     Body mass index is 21.03 kg/(m^2).    ECOG FS:1 Sclerae unicteric Oropharynx clear No peripheral adenopathy Lungs no rales or rhonchi Heart regular rate and rhythm Abd benign MSK no focal spinal tenderness, no peripheral edema Neuro: nonfocal Breasts: Right breast no suspicious findings; left breast status post lumpectomy, no evidence of local recurrence  LAB RESULTS:  Lab Results  Component Value Date   WBC 7.2 02/03/2012   NEUTROABS 5.3 02/03/2012   HGB 12.8 02/03/2012   HCT 37.5 02/03/2012   MCV 91.9 02/03/2012   PLT 289 02/03/2012      Chemistry      Component Value Date/Time   NA 142 02/03/2012 0919   K 4.9 02/03/2012 0919   CL 108 02/03/2012 0919   CO2 25 02/03/2012 0919   BUN 23 02/03/2012 0919   CREATININE 1.14* 02/03/2012 0919      Component Value Date/Time   CALCIUM 9.1 02/03/2012 0919   ALKPHOS 71 02/03/2012 0919   AST 16 02/03/2012 0919   ALT 14 02/03/2012 0919   BILITOT 0.3 02/03/2012 0919       Lab Results  Component Value Date   LABCA2 20 09/22/2011    No components found with this basename: LABCA125    No results found for this basename: INR:1;PROTIME:1 in the last 168 hours  Urinalysis No results found for this basename: colorurine, appearanceur, labspec, phurine, glucoseu, hgbur, bilirubinur, ketonesur, proteinur, urobilinogen, nitrite, leukocytesur    STUDIES: No new results found. Repeat mammography will be due September of this year  ASSESSMENT: 71 year old [place] woman  status post left lumpectomy and sentinel node sampling 12/31/2011 for a T2 N1, stage IIB invasive ductal carcinoma, grade 1,  strongly estrogen and progesterone receptor positive with a very low MIB-1 (4%) and no HER2/neu positivity. She completed radiation therapy earlier this month and is now ready to start anti-estrogens.  PLAN: We discussed aromatase inhibitors versus tamoxifen and I think she would do best with tamoxifen. We talked about the side effects toxicities and complications of this medication and she also received that information in writing. I went ahead and wrote her a prescription for 90 tablets. He will call us if she has any side effects, otherwise she will return to see [place] again in 3 months her routine followup. At that point we will likely set her up for every 6 month followup for the next 5 years.   Emillia Weatherly C    04/17/2012

## 2012-04-27 ENCOUNTER — Encounter: Payer: Self-pay | Admitting: Radiation Oncology

## 2012-05-02 ENCOUNTER — Ambulatory Visit: Payer: Medicare Other | Admitting: Radiation Oncology

## 2012-05-03 ENCOUNTER — Ambulatory Visit
Admission: RE | Admit: 2012-05-03 | Discharge: 2012-05-03 | Disposition: A | Discharge status: Home / Self Care | Payer: Medicare Other | Source: Ambulatory Visit | Attending: Radiation Oncology | Admitting: Radiation Oncology

## 2012-05-03 VITALS — BP 143/74 | HR 65 | Temp 97.1°F | Wt 106.6 lb

## 2012-05-03 DIAGNOSIS — C50919 Malignant neoplasm of unspecified site of unspecified female breast: Secondary | ICD-10-CM

## 2012-05-03 NOTE — Progress Notes (Addendum)
Followup note:  The patient returns today approximately 1 month following completion of radiation therapy following conservative surgery and management of her T2, N1, M0 invasive ductal/DCIS of the left breast. She is without complaints today. She is on tamoxifen. She tells and she'll see Dr. Johna Sheriff for a followup visit in 6 months. She'll see Dr. Darnelle Catalan again on July 29.  Physical examination: Alert and oriented. Vital signs: Wt Readings from Last 3 Encounters:  05/03/12 106 lb 9.6 oz (48.353 kg)  04/17/12 107 lb 11.2 oz (48.852 kg)  04/03/12 107 lb 4.8 oz (48.671 kg)   Temp Readings from Last 3 Encounters:  05/03/12 97.1 F (36.2 C)   04/17/12 98.1 F (36.7 C)   02/09/12 97.8 F (36.6 C) Temporal   BP Readings from Last 3 Encounters:  05/03/12 143/74  04/17/12 162/91  03/20/12 155/85   Pulse Readings from Last 3 Encounters:  05/03/12 65  04/17/12 71  03/20/12 73    Head and neck examination: Grossly unremarkable. Nodes without palpable cervical, supraclavicular or axillary lymphadenopathy. There is a 1.5-2 cm firm mass with the left axilla which is not felt to represent adenopathy but rather residual from a left axillary seroma noted when I examined her back in January of 2013. Breasts: There is residual hyperpigmentation the skin along the left breast with mild thickening. No masses are appreciated. Right breast without masses or lesions.  Impression: Satisfactory progress. As mentioned above, I do not feel that her left axillary mass represents adenopathy, but rather residual from a previously noted left axillary seroma. Of note is that this mass was just superior to her treatment fields (breast tangents) and thus I cannot be 100% confident that this is not an axillary lymph node containing metastatic disease assuming that the seroma has completely resolved. This mass could be biopsied or simply followed while she is taking tamoxifen. I defer to Dr. Darnelle Catalan and Dr. Johna Sheriff.  The patient is to let any one of [place] know if she notes growth of this mass. I will ask Dr. Johna Sheriff to see her for a routine followup visit in the near future to get his thoughts.  Plan: I will have Dr. Johna Sheriff see the patient in the near future to assess her left axilla. Again, I feel that this is the residual from a large seroma noted previously. She does not need to see me for a formal followup visit as long she sees Dr. Johna Sheriff in the near future and Dr. Darnelle Catalan at regular intervals.  Addendum: Just spoke with the patient and told her that she is to expect a call from Dr. Jamse Mead office for a followup visit.

## 2012-05-03 NOTE — Progress Notes (Signed)
Encounter addended by: Maryln Gottron, MD on: 05/03/2012 12:14 PM<BR>     Documentation filed: Notes Section

## 2012-05-03 NOTE — Progress Notes (Signed)
Patient here for follow up post completion of radiation of left breast dcis on 04/03/12.Skin looks unremarkable.Patient states she had no peeling.Mild fatigue.Has started tamoxifen and has not noticed any changes.

## 2012-05-04 ENCOUNTER — Telehealth (INDEPENDENT_AMBULATORY_CARE_PROVIDER_SITE_OTHER): Payer: Self-pay

## 2012-05-04 NOTE — Telephone Encounter (Signed)
Left message for patient to contact our office for appointment date & time (Thursday May 11, 2012 @ 12:30)

## 2012-05-11 ENCOUNTER — Encounter (INDEPENDENT_AMBULATORY_CARE_PROVIDER_SITE_OTHER): Payer: Self-pay | Admitting: General Surgery

## 2012-05-11 ENCOUNTER — Ambulatory Visit (INDEPENDENT_AMBULATORY_CARE_PROVIDER_SITE_OTHER): Payer: Medicare Other | Admitting: General Surgery

## 2012-05-11 VITALS — BP 128/82 | HR 74 | Temp 98.0°F | Ht 60.0 in | Wt 106.6 lb

## 2012-05-11 DIAGNOSIS — C50419 Malignant neoplasm of upper-outer quadrant of unspecified female breast: Secondary | ICD-10-CM

## 2012-05-11 DIAGNOSIS — R2232 Localized swelling, mass and lump, left upper limb: Secondary | ICD-10-CM

## 2012-05-11 DIAGNOSIS — R229 Localized swelling, mass and lump, unspecified: Secondary | ICD-10-CM

## 2012-05-11 NOTE — Progress Notes (Signed)
Chief complaint: Followup breast cancer, left axillary mass  History: Patient returns for followup now 4 months following left breast lumpectomy and left axillary sentinel lymph node biopsy for T2 N1 low grade ER/PR positive cancer the left breast with extracapsular extension on her sentinel node biopsy. She received postoperative radiation therapy and is now on adjuvant tamoxifen. On followup by Dr. Dayton Scrape he was able to feel a small lump in the left axilla an x-ray to see the patient back for followup lobe were clear. The patient feels well. She has no arm pain or swelling. No axillary pain. She had not noticed a lump.  Exam: General: Appears well Lymph nodes: No cervical or subclavicular nodes palpable. There is a about 1 cm firm mass palpable medially in the left axilla somewhat high. Breasts: Mild to moderate postradiation changes left breast without palpable masses or worrisome skin changes.  Assessment plan: Left axillary mass post lumpectomy and sentinel lymph node biopsy. She did have extracapsular extension and although this could be a small seroma or scar tissue from her surgery I think we need to rule out malignancy. I passed initially that we get an ultrasound of the left axilla and possible core biopsy showed this appeared all suspicious. Of course: With the results and I will see her no later than 3 months for followup.

## 2012-05-12 ENCOUNTER — Other Ambulatory Visit (INDEPENDENT_AMBULATORY_CARE_PROVIDER_SITE_OTHER): Payer: Self-pay | Admitting: General Surgery

## 2012-05-12 ENCOUNTER — Other Ambulatory Visit (INDEPENDENT_AMBULATORY_CARE_PROVIDER_SITE_OTHER): Payer: Self-pay

## 2012-05-12 DIAGNOSIS — R2232 Localized swelling, mass and lump, left upper limb: Secondary | ICD-10-CM

## 2012-05-12 DIAGNOSIS — C50419 Malignant neoplasm of upper-outer quadrant of unspecified female breast: Secondary | ICD-10-CM

## 2012-05-22 ENCOUNTER — Ambulatory Visit: Admission: RE | Admit: 2012-05-22 | Payer: Medicare Other | Source: Ambulatory Visit

## 2012-07-17 ENCOUNTER — Other Ambulatory Visit (HOSPITAL_BASED_OUTPATIENT_CLINIC_OR_DEPARTMENT_OTHER): Payer: Medicare Other | Admitting: Lab

## 2012-07-17 ENCOUNTER — Ambulatory Visit (HOSPITAL_BASED_OUTPATIENT_CLINIC_OR_DEPARTMENT_OTHER): Payer: Medicare Other | Admitting: Physician Assistant

## 2012-07-17 ENCOUNTER — Encounter: Payer: Self-pay | Admitting: Physician Assistant

## 2012-07-17 ENCOUNTER — Telehealth: Payer: Self-pay | Admitting: *Deleted

## 2012-07-17 VITALS — BP 161/88 | HR 78 | Temp 97.7°F | Ht 60.0 in | Wt 107.4 lb

## 2012-07-17 DIAGNOSIS — Z170 Estrogen receptor positive status [ER+]: Secondary | ICD-10-CM

## 2012-07-17 DIAGNOSIS — Z853 Personal history of malignant neoplasm of breast: Secondary | ICD-10-CM

## 2012-07-17 DIAGNOSIS — C50919 Malignant neoplasm of unspecified site of unspecified female breast: Secondary | ICD-10-CM

## 2012-07-17 DIAGNOSIS — C50419 Malignant neoplasm of upper-outer quadrant of unspecified female breast: Secondary | ICD-10-CM

## 2012-07-17 DIAGNOSIS — R2230 Localized swelling, mass and lump, unspecified upper limb: Secondary | ICD-10-CM | POA: Insufficient documentation

## 2012-07-17 LAB — CBC WITH DIFFERENTIAL/PLATELET
BASO%: 0.9 % (ref 0.0–2.0)
Basophils Absolute: 0 10*3/uL (ref 0.0–0.1)
EOS%: 1.2 % (ref 0.0–7.0)
HGB: 12.6 g/dL (ref 11.6–15.9)
MCH: 31.8 pg (ref 25.1–34.0)
MCHC: 33.4 g/dL (ref 31.5–36.0)
MCV: 95.3 fL (ref 79.5–101.0)
MONO%: 9.1 % (ref 0.0–14.0)
RDW: 13.7 % (ref 11.2–14.5)

## 2012-07-17 NOTE — Progress Notes (Signed)
ID: Tammie Carter   DOB: July 21, 1941  MR#: [id number]  CSN#:[id number]  HISTORY OF PRESENT ILLNESS: The patient herself palpated a mass in her left breast while showering.  She waited a couple of weeks to see if it would go away, and as it did not she brought it to Dr. Jonny Ruiz Russo's attention.  He set her up for bilateral diagnostic mammography which took place at the breast center on September 14, 2011.  This showed a spiculated mass in the upper outer quadrant of the left breast measuring 2.6 cm by mammography.  The right breast was unremarkable.  Dr. Jean Rosenthal, who performed that test, was able to palpate a firm, mobile mass in the upper outer quadrant of the left breast measuring by exam approximately 2.5 cm.  There was no palpable left axillary adenopathy.  She proceeded to left breast ultrasound which showed an irregular hypoechoic mass corresponding to the palpable finding and measuring 2.2 cm by ultrasound.  The axilla demonstrated a lymph node with mild focal cortical thickening but no additional abnormalities.   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  There was no evidence of HER2 amplification.   With this information, the patient was referred to Dr. Johna Sheriff, and bilateral breast MRIs were obtained September 17, 2011.  In the upper outer quadrant of the left breast, there was a 2.3 cm enhancing, macrolobulated mass with clip artifact.  There were several lymph nodes in the left axilla with slightly thickened cortices; the largest of these measured 1.2 cm. She undewent definitive local surgery 12/31/2011.  Her subsequent history is as detailed below.  INTERVAL HISTORY: Tammie Carter returns today with her husband [name] for followup of her left breast cancer. As a brief recap, Tammie Carter completed radiation in April of this year, and was started on tamoxifen in May. She took  tamoxifen for one month, but suffered from depression, fatigue, and bony pain while taking the medication. She discontinued it on her own and did not take tamoxifen at all in June. She tried taking it again for 7 days in early July, but again developed the same side effects and discontinued it for good.  Interval history is also remarkable for Tammie Carter having been seen by her surgeon, Dr. Johna Sheriff, in May. At that time she had a firm 1 cm mass in the left axilla and was subsequently scheduled for an ultrasound and core biopsy. There was some mix up with that appointment, and unfortunately none of this was ever completed. The mass remains, and Tammie Carter tells me it is somewhat tender to touch. It has not changed in size.   REVIEW OF SYSTEMS: Tammie Carter denies any recent illnesses and has had no fevers or chills. No skin changes or rashes. No peripheral swelling. Currently, no unusual myalgias or arthralgias, despite her history of lupus. She's had no cough or shortness of breath. No chest pain. No abnormal headaches or dizziness. She currently denies any signs of depression. Her energy level is good. She does not exercise on a regular basis, but stays very active in her home, and does a lot of yard work she tells me.  A detailed review of systems is otherwise stable and noncontributory.  PAST MEDICAL HISTORY: Past Medical History  Diagnosis Date  . Lupus   . Hypertension   . Osteoporosis   . Hx of migraines   . Thyroglossal duct cyst   .  Breast cancer 09/14/11    left breast =invasive ductal ca dx, ER/PR +, HER 2 -  . History of radiation therapy 02/15/12 to 04/03/12    left breast  Significant for systemic lupus erythematosus evaluated by Dr. Corliss Skains who, in September 2008, obtained an anti-DNA double-stranded antibody which was negative, SSA, SSB, Smith, ribonucleic protein antibody and scleroderma antibodies, all of which were negative.  The antineutrophil cytoplasmic IgG antibody was negative as well.   There was no lupus anticoagulant.  The C-reactive protein was only mildly elevated at 0.8, and there was no detectable M spike by SPEP.  Beta-2 glycoprotein antibodies and anticardiolipin antibodies were negative.  However, the patient had a positive ANA and an elevated sed rate, in addition to the arthritis, muscle pain, fatigue, all of which have improved on prednisone (she was treated initially with hydroxychloroquine with a total body rash).  Other medical problems include hypertension, glucose intolerance while on prednisone, remote history of migraines, osteoporosis, history of tobacco abuse, 30 pack years, the patient quitting 3 years ago, history of thyroglossal duct removal, history of C-section x2.  PAST SURGICAL HISTORY: Past Surgical History  Procedure Date  . Tubal ligation   . Cesarean section 1964, 1968    x2  . Breast lumpectomy 12/31/11    L breast, sn bx, node positive, ER/PR+, hER 2 NEG    FAMILY HISTORY Family History  Problem Relation Age of Onset  . Breast cancer Sister 36    now age 86 stage iv  . Cancer Sister     breast  . Heart disease Mother   . Heart disease Father   . Stroke Father   . Cancer Sister     breast, age 27  The patient's father died at the age of 30 from a stroke.  The patient's mother died at the age of 60 from heart problems.  She had 2 brothers and 1 sister, her sister now 73 was diagnosed with breast cancer at the age of 77; unfortunately, she currently has stage IV disease.  There is no other cancer in the family to her knowledge.  GYNECOLOGIC HISTORY: She had menarche age 72, last period age 71.  She never took hormone replacement therapy.  She is GX P4, first pregnancy to term at age 21.  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  Her husband, Shalia Bartko, present today, works in Research officer, political party.  Daughter, [name], 25 years old, lives in [place] and she is a Hydrologist.  Daughter. [name], goes by [place],  is 58, lives in  [place] and is a former Firefighter, currently unemployed.  Daughter, Evelise Reine, 40, lives in [place].  She is disabled secondary to diabetes and is status post a renal transplant.  The patient's fourth child, a son, committed suicide at some point in the past.   ADVANCED DIRECTIVES:  HEALTH MAINTENANCE: History  Substance Use Topics  . Smoking status: Former Smoker -- 2.0 packs/day for 50 years    Types: Cigarettes    Quit date: 05/09/2011  . Smokeless tobacco: Not on file   Comment: still smokes occass  . Alcohol Use: No     Colonoscopy:  PAP: remote  Bone density: osteoporosis  Lipid panel:  Allergies  Allergen Reactions  . Codeine Anaphylaxis  . Morphine And Related Shortness Of Breath and Nausea And Vomiting  . Tamoxifen     Made patient feel depressed, fatigued,  and caused bone and joint pain  . Erythromycin  Patient reports reaction to ALL antibiotics  . Letrozole     Made the pt's muscles and bones ache  . Penicillins   . Penicillins Cross Reactors     Current Outpatient Prescriptions  Medication Sig Dispense Refill  . lisinopril (PRINIVIL,ZESTRIL) 10 MG tablet Take 10 mg by mouth daily.       . non-metallic deodorant Thornton Papas) MISC Apply 1 application topically daily as needed.      . predniSONE (DELTASONE) 1 MG tablet Take 1 mg by mouth as needed.          OBJECTIVE: Elderly white woman who appears well Filed Vitals:   07/17/12 1502  BP: 161/88  Pulse: 78  Temp: 97.7 F (36.5 C)     Body mass index is 20.97 kg/(m^2).    ECOG FS: 0  Filed Weights   07/17/12 1502  Weight: 107 lb 6.4 oz (48.716 kg)   Physical Exam: HEENT:  Sclerae anicteric.  Oropharynx clear.    Nodes:  No cervical or supraclavicular lymphadenopathy palpated.   There is a nearly palpated a mass in the left axilla, measuring 1-1.5 cm in diameter, firm to palpation. Mild tenderness to palpation. Right axilla is benign. Breast Exam:  Right breast is benign no  masses, skin changes, or nipple inversion. Left breast is status post lumpectomy with well-healed incision. No specialist nodularity in the breast itself. No skin changes. No evidence of local recurrence in the breast. Lungs:  Clear to auscultation bilaterally.  No crackles, rhonchi, or wheezes.   Heart:  Regular rate and rhythm.   Abdomen:  Soft, thin, nontender.  Positive bowel sounds.  No organomegaly or masses palpated.   Musculoskeletal:  No focal spinal tenderness to palpation.  Extremities:  Benign.  No peripheral edema or cyanosis.   Skin:  Benign.   Neuro:  Nonfocal. Alert and oriented x3.    LAB RESULTS: Lab Results  Component Value Date   WBC 5.5 07/17/2012   NEUTROABS 3.9 07/17/2012   HGB 12.6 07/17/2012   HCT 37.6 07/17/2012   MCV 95.3 07/17/2012   PLT 196 07/17/2012      Chemistry      Component Value Date/Time   NA 142 02/03/2012 0919   K 4.9 02/03/2012 0919   CL 108 02/03/2012 0919   CO2 25 02/03/2012 0919   BUN 23 02/03/2012 0919   CREATININE 1.14* 02/03/2012 0919      Component Value Date/Time   CALCIUM 9.1 02/03/2012 0919   ALKPHOS 71 02/03/2012 0919   AST 16 02/03/2012 0919   ALT 14 02/03/2012 0919   BILITOT 0.3 02/03/2012 0919       Lab Results  Component Value Date   LABCA2 20 09/22/2011    STUDIES: No new results found. Repeat mammography will be due September of this year  ASSESSMENT: 71 year old [place] woman   (1)  status post left lumpectomy and sentinel node sampling 12/31/2011 for a T2 N1, stage IIB invasive ductal carcinoma, grade 1,  strongly estrogen and progesterone receptor positive with a very low MIB-1 (4%) and no HER2/neu positivity.   (2)  She completed radiation therapy in April 2013  (3)  Took tamoxifen briefly in May 2013, but discontinued on her own due to depression, fatigue, and pain.   PLAN:  This case was reviewed with Dr. Darnelle Catalan who also examined and spoke with the patient today. We are concerned that the lump in the left  axilla is still present, and has not decreased in size since  her previous exam. We are scheduling her for a left diagnostic mammogram, left breast ultrasound, and core biopsy to evaluate this mass further. Dr. Darnelle Catalan would like to see the patient back in 2 weeks to review those results and discuss her treatment plan from there. At that point we will discuss further her antiestrogen therapy. Perhaps she will try an aromatase inhibitor, but start out slow with dosing, perhaps beginning at a dose as low as 1 tablet weekly.   Adalea Handler    07/17/2012

## 2012-07-17 NOTE — Telephone Encounter (Signed)
Gave patient appointment for 07-21-2012 mammogram and ultrasound gave patient appointment for 08-02-2012

## 2012-07-18 ENCOUNTER — Telehealth: Payer: Self-pay | Admitting: *Deleted

## 2012-07-18 NOTE — Telephone Encounter (Signed)
Added on patient core biopsy for the breast center

## 2012-07-21 ENCOUNTER — Other Ambulatory Visit: Payer: Medicare Other

## 2012-07-25 ENCOUNTER — Ambulatory Visit
Admission: RE | Admit: 2012-07-25 | Discharge: 2012-07-25 | Disposition: A | Discharge status: Home / Self Care | Payer: Medicare Other | Source: Ambulatory Visit | Attending: Physician Assistant | Admitting: Physician Assistant

## 2012-07-25 DIAGNOSIS — C50419 Malignant neoplasm of upper-outer quadrant of unspecified female breast: Secondary | ICD-10-CM

## 2012-07-25 DIAGNOSIS — Z853 Personal history of malignant neoplasm of breast: Secondary | ICD-10-CM

## 2012-07-25 DIAGNOSIS — R2230 Localized swelling, mass and lump, unspecified upper limb: Secondary | ICD-10-CM

## 2012-07-26 ENCOUNTER — Other Ambulatory Visit: Payer: Self-pay | Admitting: Physician Assistant

## 2012-07-26 ENCOUNTER — Telehealth: Payer: Self-pay | Admitting: Oncology

## 2012-07-26 DIAGNOSIS — Z853 Personal history of malignant neoplasm of breast: Secondary | ICD-10-CM

## 2012-07-26 NOTE — Telephone Encounter (Signed)
lmonvm adviisng the pt of her mammo appt in sept at [facility]

## 2012-08-02 ENCOUNTER — Ambulatory Visit (HOSPITAL_BASED_OUTPATIENT_CLINIC_OR_DEPARTMENT_OTHER): Payer: Medicare Other | Admitting: Oncology

## 2012-08-02 ENCOUNTER — Telehealth: Payer: Self-pay | Admitting: *Deleted

## 2012-08-02 ENCOUNTER — Other Ambulatory Visit (HOSPITAL_BASED_OUTPATIENT_CLINIC_OR_DEPARTMENT_OTHER): Payer: Medicare Other | Admitting: Lab

## 2012-08-02 VITALS — BP 152/87 | HR 70 | Temp 98.1°F | Resp 20 | Ht 60.0 in | Wt 106.7 lb

## 2012-08-02 DIAGNOSIS — Z170 Estrogen receptor positive status [ER+]: Secondary | ICD-10-CM

## 2012-08-02 DIAGNOSIS — C50919 Malignant neoplasm of unspecified site of unspecified female breast: Secondary | ICD-10-CM

## 2012-08-02 DIAGNOSIS — C50419 Malignant neoplasm of upper-outer quadrant of unspecified female breast: Secondary | ICD-10-CM

## 2012-08-02 DIAGNOSIS — R2230 Localized swelling, mass and lump, unspecified upper limb: Secondary | ICD-10-CM

## 2012-08-02 DIAGNOSIS — Z853 Personal history of malignant neoplasm of breast: Secondary | ICD-10-CM

## 2012-08-02 LAB — CBC WITH DIFFERENTIAL/PLATELET
BASO%: 0.8 % (ref 0.0–2.0)
EOS%: 2.3 % (ref 0.0–7.0)
HCT: 38.4 % (ref 34.8–46.6)
LYMPH%: 21.1 % (ref 14.0–49.7)
MCH: 32.1 pg (ref 25.1–34.0)
MCHC: 33.7 g/dL (ref 31.5–36.0)
MCV: 95.4 fL (ref 79.5–101.0)
MONO#: 0.5 10*3/uL (ref 0.1–0.9)
MONO%: 9.1 % (ref 0.0–14.0)
NEUT%: 66.7 % (ref 38.4–76.8)
Platelets: 163 10*3/uL (ref 145–400)
RBC: 4.02 10*6/uL (ref 3.70–5.45)
WBC: 5.6 10*3/uL (ref 3.9–10.3)

## 2012-08-02 MED ORDER — ANASTROZOLE 1 MG PO TABS: 1.0000 mg | ORAL_TABLET | Freq: Every day | ORAL | Status: AC

## 2012-08-02 NOTE — Progress Notes (Signed)
ID: Tammie Carter   DOB: [birth year]  MR#: [id number]  WUJ#:[id number]  HISTORY OF PRESENT ILLNESS: The patient herself palpated a mass in her left breast while showering.  She waited a couple of weeks to see if it would go away, and as it did not she brought it to Dr. Jonny Ruiz Carter's attention.  He set her up for bilateral diagnostic mammography which took place at the breast center on September 14, 2011.  This showed a spiculated mass in the upper outer quadrant of the left breast measuring 2.6 cm by mammography.  The right breast was unremarkable.  Dr. Jean Carter, who performed that test, was able to palpate a firm, mobile mass in the upper outer quadrant of the left breast measuring by exam approximately 2.5 cm.  There was no palpable left axillary adenopathy.  She proceeded to left breast ultrasound which showed an irregular hypoechoic mass corresponding to the palpable finding and measuring 2.2 cm by ultrasound.  The axilla demonstrated a lymph node with mild focal cortical thickening but no additional abnormalities.   Biopsy was performed the same day and showed ([id number]) an invasive ductal carcinoma, grade 1, 99% estrogen receptor and 85% progesterone receptor positive, the MIB-1 was 4%.  There was no evidence of HER2 amplification.   With this information, the patient was referred to Dr. Johna Carter, and bilateral breast MRIs were obtained September 17, 2011.  In the upper outer quadrant of the left breast, there was a 2.3 cm enhancing, macrolobulated mass with clip artifact.  There were several lymph nodes in the left axilla with slightly thickened cortices; the largest of these measured 1.2 cm. She undewent definitive local surgery 12/31/2011.  Her subsequent history is as detailed below.  INTERVAL HISTORY: Tammie Carter returns today with her husband Tammie Carter for followup of her left breast cancer. Since the last visit here she saw her surgeon, Dr. Johna Carter, and he set her up for left mammography and  ultrasonography, which showed a 1.5 cm seroma in the left axilla.  REVIEW OF SYSTEMS: She tells me she really got depressed on tamoxifen and "that's not me". She did not have other significant side effects. Currently she is having no hot flashes, no significant joint or back pain, and no complaints regarding vaginal dryness. A detailed review of systems was otherwise noncontributory  PAST MEDICAL HISTORY: Past Medical History  Diagnosis Date  . Lupus   . Hypertension   . Osteoporosis   . Hx of migraines   . Thyroglossal duct cyst   . Breast cancer 09/14/11    left breast =invasive ductal ca dx, ER/PR +, HER 2 -  . History of radiation therapy 02/15/12 to 04/03/12    left breast  Significant for systemic lupus erythematosus evaluated by Dr. Corliss Skains who, in September 2008, obtained an anti-DNA double-stranded antibody which was negative, SSA, SSB, Smith, ribonucleic protein antibody and scleroderma antibodies, all of which were negative.  The antineutrophil cytoplasmic IgG antibody was negative as well.  There was no lupus anticoagulant.  The C-reactive protein was only mildly elevated at 0.8, and there was no detectable M spike by SPEP.  Beta-2 glycoprotein antibodies and anticardiolipin antibodies were negative.  However, the patient had a positive ANA and an elevated sed rate, in addition to the arthritis, muscle pain, fatigue, all of which have improved on prednisone (she was treated initially with hydroxychloroquine with a total body rash).  Other medical problems include hypertension, glucose intolerance while on prednisone, remote history of migraines, osteoporosis, history of  tobacco abuse, 30 pack years, the patient quitting 3 years ago, history of thyroglossal duct removal, history of C-section x2.  PAST SURGICAL HISTORY: Past Surgical History  Procedure Date  . Tubal ligation   . Cesarean section 1964, 1968    x2  . Breast lumpectomy 12/31/11    L breast, sn bx, node positive,  ER/PR+, hER 2 NEG    FAMILY HISTORY Family History  Problem Relation Age of Onset  . Breast cancer Sister 4    now age 71 stage iv  . Cancer Sister     breast  . Heart disease Mother   . Heart disease Father   . Stroke Father   . Cancer Sister     breast, age 58  The patient's father died at the age of 2 from a stroke.  The patient's mother died at the age of 46 from heart problems.  She had 2 brothers and 1 sister, her sister now 32 was diagnosed with breast cancer at the age of 60; unfortunately, she currently has stage IV disease.  There is no other cancer in the family to her knowledge.  GYNECOLOGIC HISTORY: She had menarche age 51, last period age 9.  She never took hormone replacement therapy.  She is GX P4, first pregnancy to term at age 26.  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  Her husband, [name], present today, works in Research officer, political party.  Daughter, [name], 86 years old, lives in [place] and she is a Hydrologist.  Daughter. [name], goes by [place],  is 32, lives in [place] and is a former Firefighter, currently unemployed.  Daughter, [name], 70, lives in [place], [place].  She is disabled secondary to diabetes and is status post a renal transplant.  The patient's fourth child, a son, committed suicide at some point in the past.   ADVANCED DIRECTIVES:  HEALTH MAINTENANCE: History  Substance Use Topics  . Smoking status: Former Smoker -- 2.0 packs/day for 50 years    Types: Cigarettes    Quit date: 05/09/2011  . Smokeless tobacco: Not on file   Comment: still smokes occass  . Alcohol Use: No     Colonoscopy:  PAP: remote  Bone density: osteoporosis  Lipid panel:  Allergies  Allergen Reactions  . Codeine Anaphylaxis  . Morphine And Related Shortness Of Breath and Nausea And Vomiting  . Tamoxifen     Made patient feel depressed, fatigued,  and caused bone and joint pain  . Erythromycin     Patient reports reaction  to ALL antibiotics  . Letrozole     Made the pt's muscles and bones ache  . Penicillins   . Penicillins Cross Reactors     Current Outpatient Prescriptions  Medication Sig Dispense Refill  . lisinopril (PRINIVIL,ZESTRIL) 10 MG tablet Take 10 mg by mouth daily.       . non-metallic deodorant Thornton Papas) MISC Apply 1 application topically daily as needed.      . predniSONE (DELTASONE) 1 MG tablet Take 1 mg by mouth as needed.          OBJECTIVE: Elderly white woman who appears well Filed Vitals:   08/02/12 0822  BP: 152/87  Pulse: 70  Temp: 98.1 F (36.7 C)  Resp: 20     Body mass index is 20.84 kg/(m^2).    ECOG FS: 0  Filed Weights   08/02/12 0822  Weight: 106 lb 11.2 oz (48.399 kg)   Sclerae unicteric Oropharynx clear  No cervical or supraclavicular adenopathy Lungs no rales or rhonchi Heart regular rate and rhythm Abd benign MSK no focal spinal tenderness, no peripheral edema Neuro: nonfocal Breasts: Right breast is unremarkable. The left breast is status post lumpectomy and radiation. There is no evidence of local recurrence. There is a rubbery movable mass in the left axilla measuring between 1 and 2 cm, nontender, nonerythematous.   LAB RESULTS: Lab Results  Component Value Date   WBC 5.6 08/02/2012   NEUTROABS 3.7 08/02/2012   HGB 12.9 08/02/2012   HCT 38.4 08/02/2012   MCV 95.4 08/02/2012   PLT 163 08/02/2012      Chemistry      Component Value Date/Time   NA 142 02/03/2012 0919   K 4.9 02/03/2012 0919   CL 108 02/03/2012 0919   CO2 25 02/03/2012 0919   BUN 23 02/03/2012 0919   CREATININE 1.14* 02/03/2012 0919      Component Value Date/Time   CALCIUM 9.1 02/03/2012 0919   ALKPHOS 71 02/03/2012 0919   AST 16 02/03/2012 0919   ALT 14 02/03/2012 0919   BILITOT 0.3 02/03/2012 0919       Lab Results  Component Value Date   LABCA2 20 09/22/2011    STUDIES: US Breast Left  07/25/2012  *RADIOLOGY REPORT*  Clinical Data:  Mass felt by the patient in the left axilla  since left lumpectomy and left sentinel node resection last year.  The patient reports that the mass has not changed in size during that time and has become tender over the last several months.  DIGITAL DIAGNOSTIC LEFT MAMMOGRAM WITH CAD AND LEFT BREAST ULTRASOUND:  Comparison:  Previous examinations.  Findings:  Stable predominately fatty fibroglandular tissue in the left breast.  Interval post lumpectomy changes deep in the upper outer portion of the breast with associated surgical clips. Interval postradiation changes in the left breast.  There is a rounded, smoothly marginated mass in the left axilla with adjacent surgical clips.  The margins of this mass are smooth with the exception of irregular or obscured margins inferiorly. Mammographic images were processed with CAD.  On physical exam, the patient has an approximately 1.5 cm rounded, soft, fluctuant palpable mass in the left axilla.  Ultrasound is performed, showing a 1.5 x 1.4 x 1.3 cm rounded, smoothly marginated, fluid-filled mass in the left axilla with bright posterior acoustical enhancement.  IMPRESSION: 1.5 cm postoperative seroma in the left axilla.  No evidence of malignancy.  A follow up bilateral diagnostic mammogram is recommended in 2 months.  That will be 1 year since mammographic evaluation of the right breast.  RECOMMENDATION: Bilateral diagnostic mammogram in 2 months.  BI-RADS CATEGORY 2:  Benign finding(s).  Original Report Authenticated By: Darrol Angel, M.D.   Mm Digital Diagnostic Unilat L  07/25/2012  *RADIOLOGY REPORT*  Clinical Data:  Mass felt by the patient in the left axilla since left lumpectomy and left sentinel node resection last year.  The patient reports that the mass has not changed in size during that time and has become tender over the last several months.  DIGITAL DIAGNOSTIC LEFT MAMMOGRAM WITH CAD AND LEFT BREAST ULTRASOUND:  Comparison:  Previous examinations.  Findings:  Stable predominately fatty fibroglandular  tissue in the left breast.  Interval post lumpectomy changes deep in the upper outer portion of the breast with associated surgical clips. Interval postradiation changes in the left breast.  There is a rounded, smoothly marginated mass in the left axilla with adjacent  surgical clips.  The margins of this mass are smooth with the exception of irregular or obscured margins inferiorly. Mammographic images were processed with CAD.  On physical exam, the patient has an approximately 1.5 cm rounded, soft, fluctuant palpable mass in the left axilla.  Ultrasound is performed, showing a 1.5 x 1.4 x 1.3 cm rounded, smoothly marginated, fluid-filled mass in the left axilla with bright posterior acoustical enhancement.  IMPRESSION: 1.5 cm postoperative seroma in the left axilla.  No evidence of malignancy.  A follow up bilateral diagnostic mammogram is recommended in 2 months.  That will be 1 year since mammographic evaluation of the right breast.  RECOMMENDATION: Bilateral diagnostic mammogram in 2 months.  BI-RADS CATEGORY 2:  Benign finding(s).  Original Report Authenticated By: Darrol Angel, M.D.    ASSESSMENT: 72 year old [place] woman   (1)  status post left lumpectomy and sentinel node sampling 12/31/2011 for a T2 N1, stage IIB invasive ductal carcinoma, grade 1,  strongly estrogen and progesterone receptor positive with a very low MIB-1 (4%) and no HER2/neu positivity.   (2)  She completed radiation therapy in April 2013  (3)  Took tamoxifen briefly in May 2013, but discontinued on her own due to depression, fatigue, and pain.  (4) to start anastrozole August 2013   PLAN:  As far as the left axillary seroma is concerned, we discussed possible drainage, and she does not 1 to do anything about it at this point. It is not bothering her very much. She had an appointment with Dr. Johna Carter for next week, but she is canceling that. If she does develop problems, she will call his office for consideration of  drainage.  We then went over her general cancer situation again. She understands she would benefit greatly in terms of risk reduction from anti-estrogens. We decided we would try anastrozole, and we discussed the possible side effects toxicities and complications. Specifically she will take anastrozole once a week for 4 weeks if she has no problems she would go to twice a week for another month, and so on. Hopefully we will be able to continue to itch up the dose until she is taking one tablet daily. I went ahead and wrote her the prescription and asked her call me with any problems. Otherwise she will see me again in April of next year. She knows to call for any other issues related to her cancer before that date.  MAGRINAT,GUSTAV C    08/02/2012

## 2012-08-02 NOTE — Telephone Encounter (Signed)
Mailed out calendar to inform the patient of the new date and time in 03-2013 

## 2012-08-08 ENCOUNTER — Ambulatory Visit: Payer: Medicare Other | Admitting: Oncology

## 2012-08-11 ENCOUNTER — Encounter (INDEPENDENT_AMBULATORY_CARE_PROVIDER_SITE_OTHER): Payer: Medicare Other | Admitting: General Surgery

## 2013-03-03 ENCOUNTER — Telehealth: Payer: Self-pay | Admitting: Oncology

## 2013-03-03 NOTE — Telephone Encounter (Signed)
4/16 appt moved to 5/1 due to GM in [facility]. lmonvm for pt on both home and cell. Schedule mailed. 4/7 lb appt remains.

## 2013-03-26 ENCOUNTER — Other Ambulatory Visit: Payer: Medicare Other | Admitting: Lab

## 2013-04-04 ENCOUNTER — Other Ambulatory Visit: Payer: Medicare Other | Admitting: Lab

## 2013-04-04 ENCOUNTER — Ambulatory Visit: Payer: Medicare Other | Admitting: Oncology

## 2013-04-16 ENCOUNTER — Telehealth: Payer: Self-pay | Admitting: *Deleted

## 2013-04-16 NOTE — Telephone Encounter (Signed)
Pt called stating that she fell and broke her knee and cannot come in for her appt on 04/19/13.  She wishes to cancel at this time and will call back when she gets better.  Cancelled appt as pt requested and informed Dawn.

## 2013-04-19 ENCOUNTER — Ambulatory Visit: Payer: Self-pay | Admitting: Oncology

## 2013-07-23 IMAGING — CT CT CHEST W/ CM
2 of 3 series · 15 of 36 positions shown, 18 images · IV contrast (APPLIED)
Comparison: MRI 09/17/2011.  No prior CT.

CLINICAL DATA: New diagnosis of left-sided breast cancer.  No
treatments yet.

CT CHEST WITH CONTRAST
TECHNIQUE: Multidetector CT imaging of the chest was performed
following the standard protocol during bolus administration of
intravenous contrast.
Contrast: 80mL OMNIPAQUE IOHEXOL 300 MG/ML IV SOLN

[Series 2: chest with st · axial · 0.62mm/px · z∈[+1076,+1311]mm · 12 of 57 slices shown, 15 images]
[im 5/57  mediastinal]
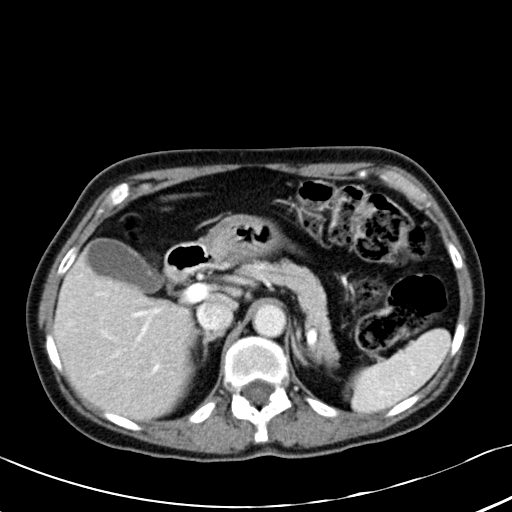
[im 5/57  lung]
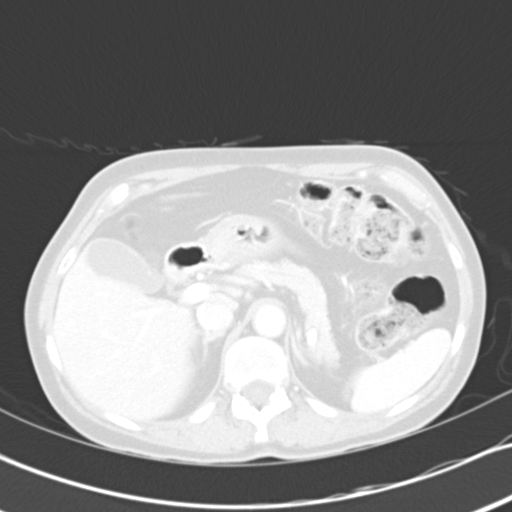
[im 9/57  lung]
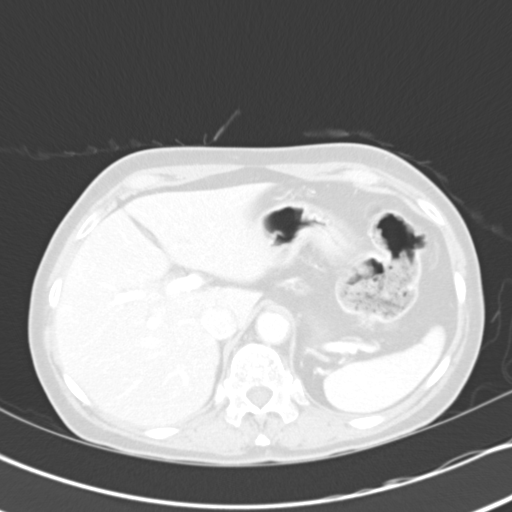
[im 13/57  lung]
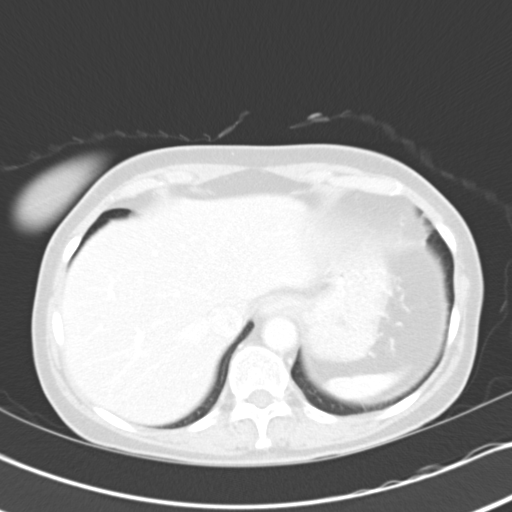
[im 17/57  lung]
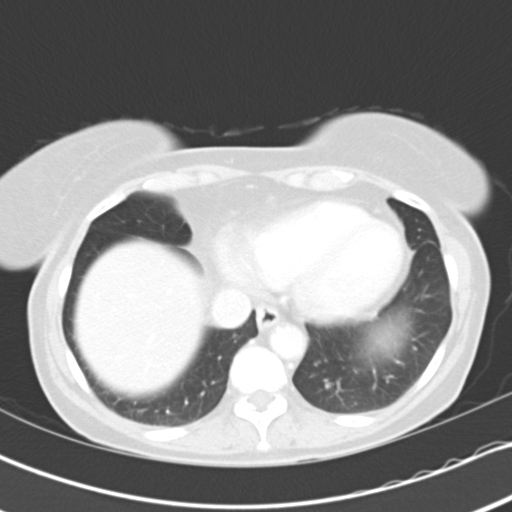
[im 21/57  mediastinal]
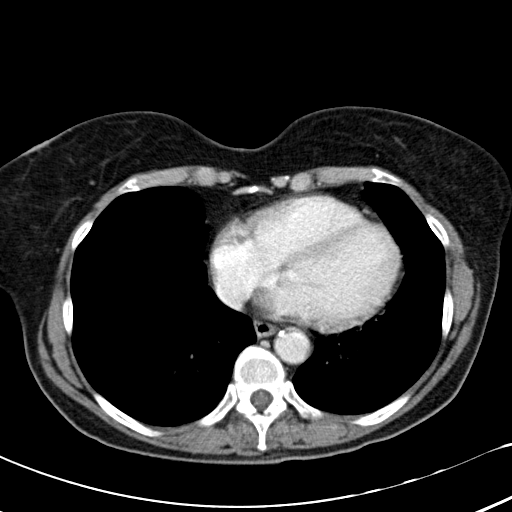
[im 21/57  lung]
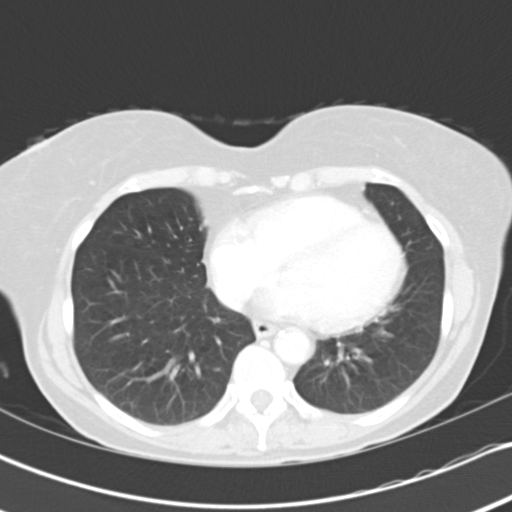
[im 25/57  lung]
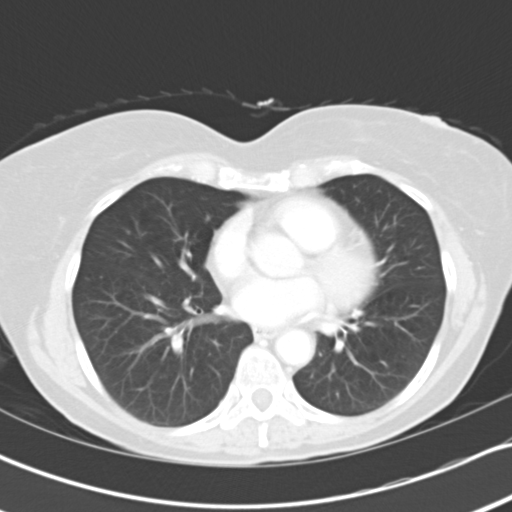
[im 32/57  lung]
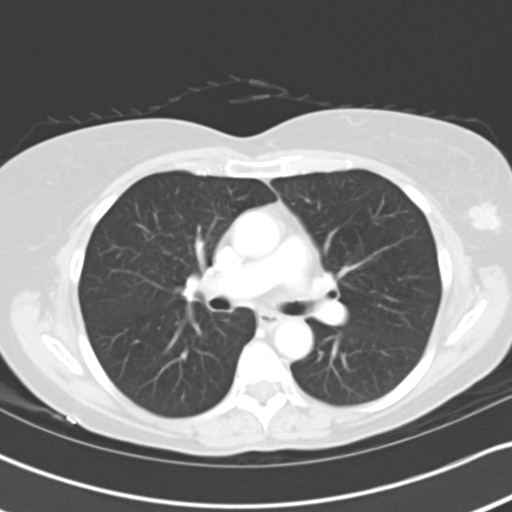
[im 36/57  lung]
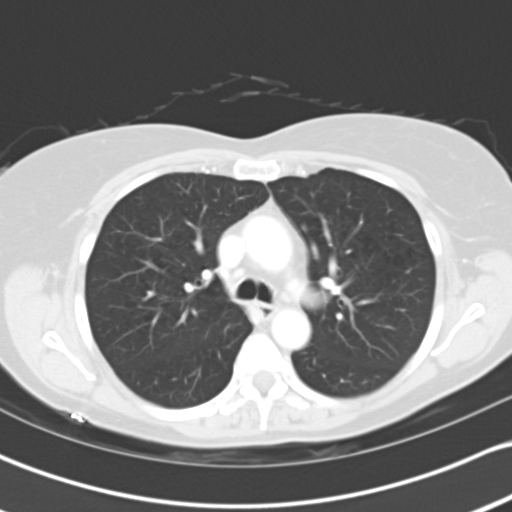
[im 40/57  mediastinal]
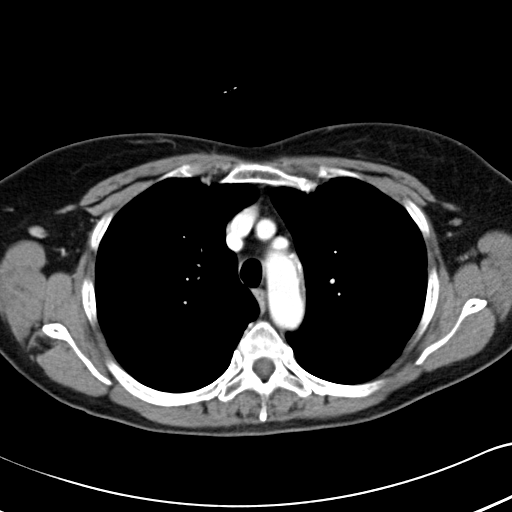
[im 40/57  lung]
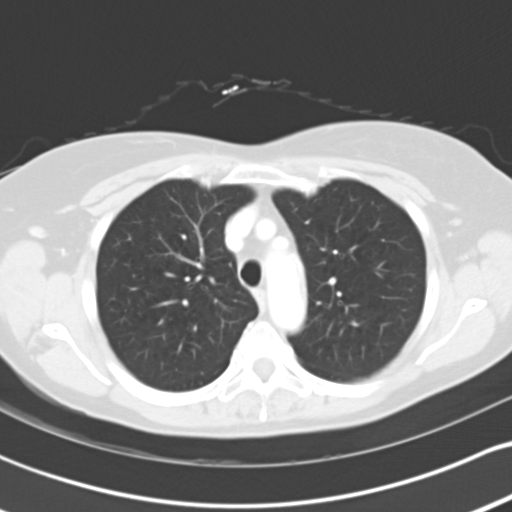
[im 44/57  lung]
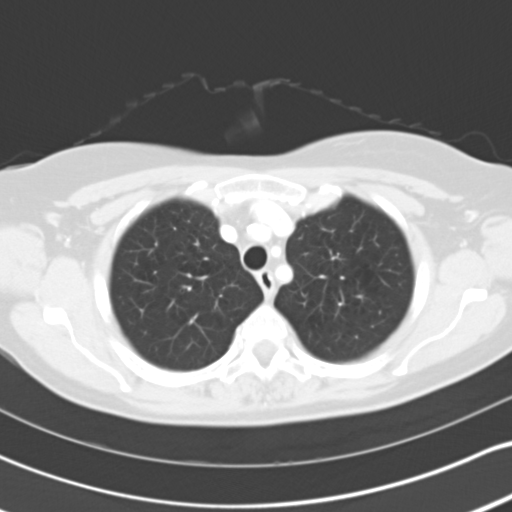
[im 48/57  lung]
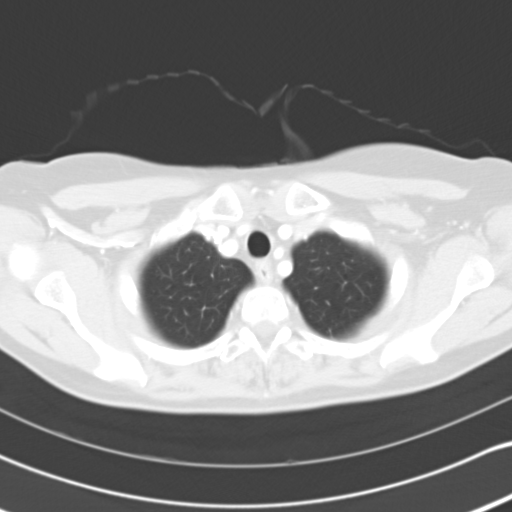
[im 52/57  lung]
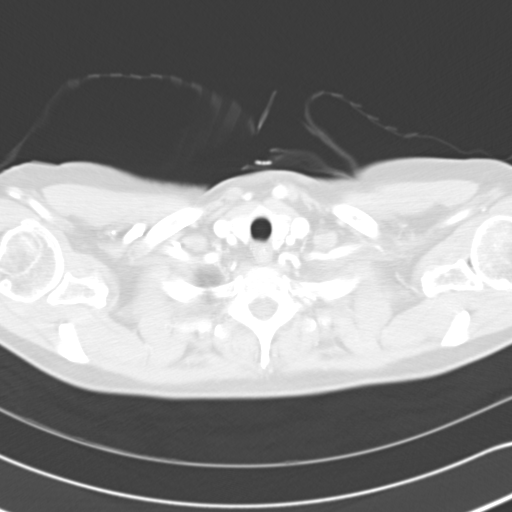

[Series 602: <mpr thick range> · coronal · 0.62mm/px · 3 of 75 slices shown]
[im 15/75  lung]
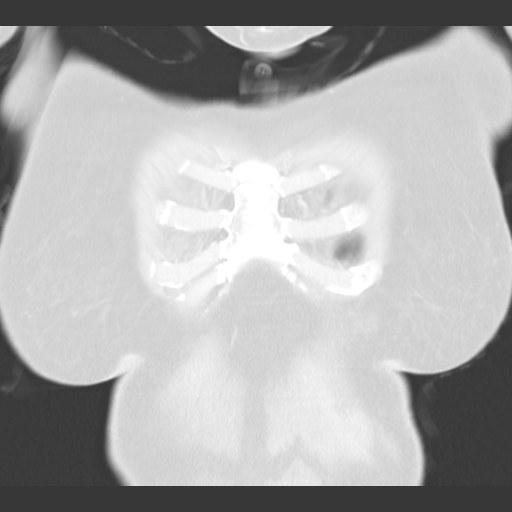
[im 30/75  lung]
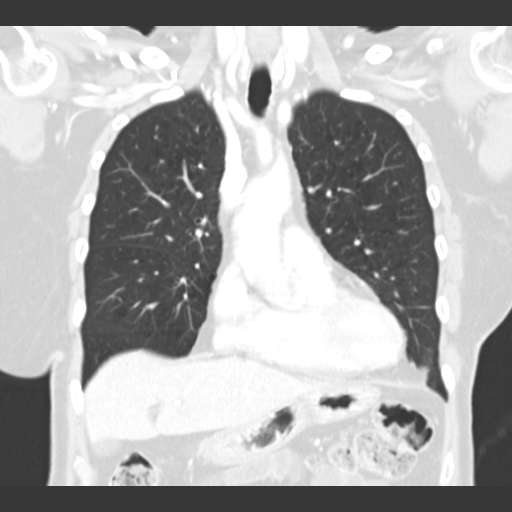
[im 45/75  lung]
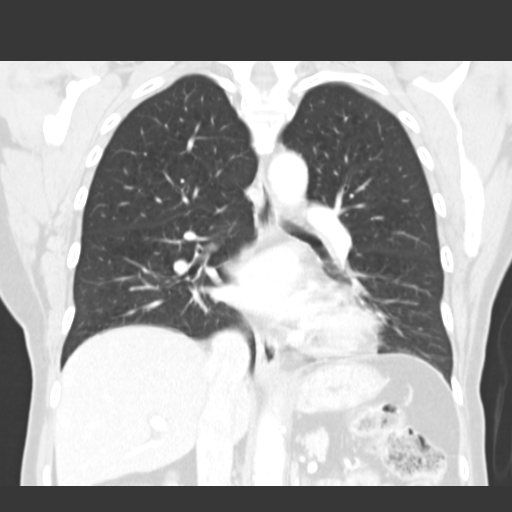

[15 of 36 positions shown; findings below may reference images not displayed]

FINDINGS: Lung windows demonstrate minimal centrilobular emphysema.
No nodules or airspace opacities.

Soft tissue windows demonstrate left breast primary, which measures
2.0 cm in the lateral left breast on image 25.  No axillary
adenopathy.  Normal heart size, accentuated by pectus excavatum
deformity. No pericardial or pleural effusion.  No mediastinal or
hilar adenopathy.  No internal mammary adenopathy.

Limited abdominal imaging demonstrates possible gallstone or sludge
on coronal image 34. Normal adrenal glands.  No acute osseous
abnormality.
IMPRESSION: 1.  Left breast primary.  No evidence of metastatic disease or
acute process in the chest.
2.  Possible gallstones or sludge.

## 2014-10-21 ENCOUNTER — Encounter: Payer: Self-pay | Admitting: Physician Assistant

## 2015-01-09 ENCOUNTER — Other Ambulatory Visit: Payer: Self-pay | Admitting: Internal Medicine

## 2015-01-09 DIAGNOSIS — I10 Essential (primary) hypertension: Secondary | ICD-10-CM | POA: Diagnosis not present

## 2015-01-09 DIAGNOSIS — M329 Systemic lupus erythematosus, unspecified: Secondary | ICD-10-CM | POA: Diagnosis not present

## 2015-01-09 DIAGNOSIS — Z9889 Other specified postprocedural states: Secondary | ICD-10-CM

## 2015-01-09 DIAGNOSIS — Z853 Personal history of malignant neoplasm of breast: Secondary | ICD-10-CM

## 2015-01-09 DIAGNOSIS — Z1389 Encounter for screening for other disorder: Secondary | ICD-10-CM | POA: Diagnosis not present

## 2015-01-09 DIAGNOSIS — F17210 Nicotine dependence, cigarettes, uncomplicated: Secondary | ICD-10-CM | POA: Diagnosis not present

## 2015-01-09 DIAGNOSIS — G43009 Migraine without aura, not intractable, without status migrainosus: Secondary | ICD-10-CM | POA: Diagnosis not present

## 2015-01-09 DIAGNOSIS — B029 Zoster without complications: Secondary | ICD-10-CM | POA: Diagnosis not present

## 2015-01-09 DIAGNOSIS — H209 Unspecified iridocyclitis: Secondary | ICD-10-CM | POA: Diagnosis not present

## 2015-01-09 DIAGNOSIS — C50912 Malignant neoplasm of unspecified site of left female breast: Secondary | ICD-10-CM | POA: Diagnosis not present

## 2016-07-22 DIAGNOSIS — Z6820 Body mass index (BMI) 20.0-20.9, adult: Secondary | ICD-10-CM | POA: Diagnosis not present

## 2016-07-22 DIAGNOSIS — M329 Systemic lupus erythematosus, unspecified: Secondary | ICD-10-CM | POA: Diagnosis not present

## 2016-07-22 DIAGNOSIS — H209 Unspecified iridocyclitis: Secondary | ICD-10-CM | POA: Diagnosis not present

## 2016-07-22 DIAGNOSIS — I10 Essential (primary) hypertension: Secondary | ICD-10-CM | POA: Diagnosis not present

## 2016-07-22 DIAGNOSIS — R69 Illness, unspecified: Secondary | ICD-10-CM | POA: Diagnosis not present

## 2016-07-22 DIAGNOSIS — C50912 Malignant neoplasm of unspecified site of left female breast: Secondary | ICD-10-CM | POA: Diagnosis not present

## 2016-07-22 DIAGNOSIS — Z1389 Encounter for screening for other disorder: Secondary | ICD-10-CM | POA: Diagnosis not present

## 2016-07-28 DIAGNOSIS — I10 Essential (primary) hypertension: Secondary | ICD-10-CM | POA: Diagnosis not present

## 2016-07-28 DIAGNOSIS — Z6820 Body mass index (BMI) 20.0-20.9, adult: Secondary | ICD-10-CM | POA: Diagnosis not present

## 2016-11-16 DIAGNOSIS — H35033 Hypertensive retinopathy, bilateral: Secondary | ICD-10-CM | POA: Diagnosis not present

## 2016-11-16 DIAGNOSIS — H2513 Age-related nuclear cataract, bilateral: Secondary | ICD-10-CM | POA: Diagnosis not present

## 2016-11-16 DIAGNOSIS — H209 Unspecified iridocyclitis: Secondary | ICD-10-CM | POA: Diagnosis not present

## 2016-11-16 DIAGNOSIS — H3581 Retinal edema: Secondary | ICD-10-CM | POA: Diagnosis not present

## 2016-11-19 DIAGNOSIS — M329 Systemic lupus erythematosus, unspecified: Secondary | ICD-10-CM | POA: Diagnosis not present

## 2016-11-19 DIAGNOSIS — R69 Illness, unspecified: Secondary | ICD-10-CM | POA: Diagnosis not present

## 2016-11-19 DIAGNOSIS — I10 Essential (primary) hypertension: Secondary | ICD-10-CM | POA: Diagnosis not present

## 2016-11-19 DIAGNOSIS — Z6820 Body mass index (BMI) 20.0-20.9, adult: Secondary | ICD-10-CM | POA: Diagnosis not present

## 2016-11-19 DIAGNOSIS — H268 Other specified cataract: Secondary | ICD-10-CM | POA: Diagnosis not present

## 2016-12-01 DIAGNOSIS — H2513 Age-related nuclear cataract, bilateral: Secondary | ICD-10-CM | POA: Diagnosis not present

## 2017-01-17 DIAGNOSIS — H2513 Age-related nuclear cataract, bilateral: Secondary | ICD-10-CM | POA: Diagnosis not present

## 2017-01-17 DIAGNOSIS — Z880 Allergy status to penicillin: Secondary | ICD-10-CM | POA: Diagnosis not present

## 2017-01-17 DIAGNOSIS — R69 Illness, unspecified: Secondary | ICD-10-CM | POA: Diagnosis not present

## 2017-01-17 DIAGNOSIS — I10 Essential (primary) hypertension: Secondary | ICD-10-CM | POA: Diagnosis not present

## 2017-01-17 DIAGNOSIS — Z885 Allergy status to narcotic agent status: Secondary | ICD-10-CM | POA: Diagnosis not present

## 2017-01-17 DIAGNOSIS — Z79899 Other long term (current) drug therapy: Secondary | ICD-10-CM | POA: Diagnosis not present

## 2017-01-17 DIAGNOSIS — Z888 Allergy status to other drugs, medicaments and biological substances status: Secondary | ICD-10-CM | POA: Diagnosis not present

## 2017-01-17 DIAGNOSIS — Z881 Allergy status to other antibiotic agents status: Secondary | ICD-10-CM | POA: Diagnosis not present

## 2017-01-20 DIAGNOSIS — R69 Illness, unspecified: Secondary | ICD-10-CM | POA: Diagnosis not present

## 2017-01-20 DIAGNOSIS — I10 Essential (primary) hypertension: Secondary | ICD-10-CM | POA: Diagnosis not present

## 2017-01-20 DIAGNOSIS — H25812 Combined forms of age-related cataract, left eye: Secondary | ICD-10-CM | POA: Diagnosis not present

## 2017-02-10 DIAGNOSIS — I10 Essential (primary) hypertension: Secondary | ICD-10-CM | POA: Diagnosis not present

## 2017-02-10 DIAGNOSIS — H25811 Combined forms of age-related cataract, right eye: Secondary | ICD-10-CM | POA: Diagnosis not present

## 2017-02-10 DIAGNOSIS — R69 Illness, unspecified: Secondary | ICD-10-CM | POA: Diagnosis not present

## 2017-04-15 DIAGNOSIS — Z6821 Body mass index (BMI) 21.0-21.9, adult: Secondary | ICD-10-CM | POA: Diagnosis not present

## 2017-04-15 DIAGNOSIS — I10 Essential (primary) hypertension: Secondary | ICD-10-CM | POA: Diagnosis not present

## 2017-04-15 DIAGNOSIS — R69 Illness, unspecified: Secondary | ICD-10-CM | POA: Diagnosis not present

## 2017-04-15 DIAGNOSIS — M329 Systemic lupus erythematosus, unspecified: Secondary | ICD-10-CM | POA: Diagnosis not present

## 2017-04-15 DIAGNOSIS — J209 Acute bronchitis, unspecified: Secondary | ICD-10-CM | POA: Diagnosis not present

## 2017-04-15 DIAGNOSIS — R05 Cough: Secondary | ICD-10-CM | POA: Diagnosis not present

## 2018-06-16 DIAGNOSIS — H26492 Other secondary cataract, left eye: Secondary | ICD-10-CM | POA: Diagnosis not present

## 2018-06-29 DIAGNOSIS — M329 Systemic lupus erythematosus, unspecified: Secondary | ICD-10-CM | POA: Diagnosis not present

## 2018-06-29 DIAGNOSIS — G43009 Migraine without aura, not intractable, without status migrainosus: Secondary | ICD-10-CM | POA: Diagnosis not present

## 2018-06-29 DIAGNOSIS — R69 Illness, unspecified: Secondary | ICD-10-CM | POA: Diagnosis not present

## 2018-06-29 DIAGNOSIS — M810 Age-related osteoporosis without current pathological fracture: Secondary | ICD-10-CM | POA: Diagnosis not present

## 2018-06-29 DIAGNOSIS — I10 Essential (primary) hypertension: Secondary | ICD-10-CM | POA: Diagnosis not present

## 2018-06-29 DIAGNOSIS — J449 Chronic obstructive pulmonary disease, unspecified: Secondary | ICD-10-CM | POA: Diagnosis not present

## 2018-06-29 DIAGNOSIS — H209 Unspecified iridocyclitis: Secondary | ICD-10-CM | POA: Diagnosis not present

## 2018-06-29 DIAGNOSIS — C50912 Malignant neoplasm of unspecified site of left female breast: Secondary | ICD-10-CM | POA: Diagnosis not present

## 2018-06-29 DIAGNOSIS — H268 Other specified cataract: Secondary | ICD-10-CM | POA: Diagnosis not present

## 2018-06-29 DIAGNOSIS — Z6821 Body mass index (BMI) 21.0-21.9, adult: Secondary | ICD-10-CM | POA: Diagnosis not present

## 2018-07-10 DIAGNOSIS — H26491 Other secondary cataract, right eye: Secondary | ICD-10-CM | POA: Diagnosis not present

## 2021-09-12 ENCOUNTER — Encounter (HOSPITAL_COMMUNITY): Payer: Self-pay | Admitting: Emergency Medicine

## 2021-09-12 ENCOUNTER — Emergency Department (HOSPITAL_COMMUNITY): Payer: Medicare HMO

## 2021-09-12 ENCOUNTER — Inpatient Hospital Stay (HOSPITAL_COMMUNITY)
Admission: EM | Admit: 2021-09-12 | Discharge: 2021-09-14 | DRG: 064 | Disposition: A | Discharge status: Home Health | Payer: Medicare HMO | Attending: Internal Medicine | Admitting: Internal Medicine

## 2021-09-12 ENCOUNTER — Inpatient Hospital Stay (HOSPITAL_COMMUNITY): Payer: Medicare HMO

## 2021-09-12 ENCOUNTER — Other Ambulatory Visit: Payer: Self-pay

## 2021-09-12 DIAGNOSIS — G8194 Hemiplegia, unspecified affecting left nondominant side: Secondary | ICD-10-CM | POA: Diagnosis present

## 2021-09-12 DIAGNOSIS — R739 Hyperglycemia, unspecified: Secondary | ICD-10-CM | POA: Diagnosis present

## 2021-09-12 DIAGNOSIS — Z853 Personal history of malignant neoplasm of breast: Secondary | ICD-10-CM

## 2021-09-12 DIAGNOSIS — F17200 Nicotine dependence, unspecified, uncomplicated: Secondary | ICD-10-CM | POA: Diagnosis present

## 2021-09-12 DIAGNOSIS — Z9114 Patient's other noncompliance with medication regimen: Secondary | ICD-10-CM

## 2021-09-12 DIAGNOSIS — Z8249 Family history of ischemic heart disease and other diseases of the circulatory system: Secondary | ICD-10-CM

## 2021-09-12 DIAGNOSIS — I639 Cerebral infarction, unspecified: Secondary | ICD-10-CM

## 2021-09-12 DIAGNOSIS — I129 Hypertensive chronic kidney disease with stage 1 through stage 4 chronic kidney disease, or unspecified chronic kidney disease: Secondary | ICD-10-CM | POA: Diagnosis present

## 2021-09-12 DIAGNOSIS — Z885 Allergy status to narcotic agent status: Secondary | ICD-10-CM

## 2021-09-12 DIAGNOSIS — I6522 Occlusion and stenosis of left carotid artery: Secondary | ICD-10-CM | POA: Diagnosis present

## 2021-09-12 DIAGNOSIS — R29705 NIHSS score 5: Secondary | ICD-10-CM | POA: Diagnosis present

## 2021-09-12 DIAGNOSIS — G319 Degenerative disease of nervous system, unspecified: Secondary | ICD-10-CM | POA: Diagnosis not present

## 2021-09-12 DIAGNOSIS — I63233 Cerebral infarction due to unspecified occlusion or stenosis of bilateral carotid arteries: Secondary | ICD-10-CM | POA: Diagnosis not present

## 2021-09-12 DIAGNOSIS — G8190 Hemiplegia, unspecified affecting unspecified side: Secondary | ICD-10-CM | POA: Diagnosis not present

## 2021-09-12 DIAGNOSIS — Z743 Need for continuous supervision: Secondary | ICD-10-CM | POA: Diagnosis not present

## 2021-09-12 DIAGNOSIS — N1831 Chronic kidney disease, stage 3a: Secondary | ICD-10-CM | POA: Diagnosis present

## 2021-09-12 DIAGNOSIS — Z9119 Patient's noncompliance with other medical treatment and regimen: Secondary | ICD-10-CM

## 2021-09-12 DIAGNOSIS — Z66 Do not resuscitate: Secondary | ICD-10-CM | POA: Diagnosis present

## 2021-09-12 DIAGNOSIS — I6381 Other cerebral infarction due to occlusion or stenosis of small artery: Secondary | ICD-10-CM | POA: Diagnosis not present

## 2021-09-12 DIAGNOSIS — R4701 Aphasia: Secondary | ICD-10-CM | POA: Diagnosis not present

## 2021-09-12 DIAGNOSIS — Z803 Family history of malignant neoplasm of breast: Secondary | ICD-10-CM | POA: Diagnosis not present

## 2021-09-12 DIAGNOSIS — R29810 Facial weakness: Secondary | ICD-10-CM | POA: Diagnosis present

## 2021-09-12 DIAGNOSIS — Z79899 Other long term (current) drug therapy: Secondary | ICD-10-CM

## 2021-09-12 DIAGNOSIS — Z823 Family history of stroke: Secondary | ICD-10-CM

## 2021-09-12 DIAGNOSIS — M329 Systemic lupus erythematosus, unspecified: Secondary | ICD-10-CM | POA: Diagnosis not present

## 2021-09-12 DIAGNOSIS — Z28310 Unvaccinated for covid-19: Secondary | ICD-10-CM

## 2021-09-12 DIAGNOSIS — M810 Age-related osteoporosis without current pathological fracture: Secondary | ICD-10-CM | POA: Diagnosis not present

## 2021-09-12 DIAGNOSIS — I6329 Cerebral infarction due to unspecified occlusion or stenosis of other precerebral arteries: Secondary | ICD-10-CM | POA: Diagnosis not present

## 2021-09-12 DIAGNOSIS — E042 Nontoxic multinodular goiter: Secondary | ICD-10-CM | POA: Diagnosis not present

## 2021-09-12 DIAGNOSIS — I10 Essential (primary) hypertension: Secondary | ICD-10-CM | POA: Diagnosis not present

## 2021-09-12 DIAGNOSIS — R531 Weakness: Secondary | ICD-10-CM | POA: Diagnosis not present

## 2021-09-12 DIAGNOSIS — I672 Cerebral atherosclerosis: Secondary | ICD-10-CM | POA: Diagnosis not present

## 2021-09-12 DIAGNOSIS — Z170 Estrogen receptor positive status [ER+]: Secondary | ICD-10-CM

## 2021-09-12 DIAGNOSIS — R29898 Other symptoms and signs involving the musculoskeletal system: Secondary | ICD-10-CM | POA: Diagnosis not present

## 2021-09-12 DIAGNOSIS — W1830XA Fall on same level, unspecified, initial encounter: Secondary | ICD-10-CM | POA: Diagnosis present

## 2021-09-12 DIAGNOSIS — R7303 Prediabetes: Secondary | ICD-10-CM | POA: Diagnosis not present

## 2021-09-12 DIAGNOSIS — Z923 Personal history of irradiation: Secondary | ICD-10-CM

## 2021-09-12 DIAGNOSIS — I6503 Occlusion and stenosis of bilateral vertebral arteries: Secondary | ICD-10-CM | POA: Diagnosis not present

## 2021-09-12 DIAGNOSIS — M47812 Spondylosis without myelopathy or radiculopathy, cervical region: Secondary | ICD-10-CM | POA: Diagnosis not present

## 2021-09-12 DIAGNOSIS — Z881 Allergy status to other antibiotic agents status: Secondary | ICD-10-CM

## 2021-09-12 DIAGNOSIS — Z20822 Contact with and (suspected) exposure to covid-19: Secondary | ICD-10-CM | POA: Diagnosis not present

## 2021-09-12 DIAGNOSIS — I6502 Occlusion and stenosis of left vertebral artery: Secondary | ICD-10-CM | POA: Diagnosis not present

## 2021-09-12 DIAGNOSIS — Z888 Allergy status to other drugs, medicaments and biological substances status: Secondary | ICD-10-CM | POA: Diagnosis not present

## 2021-09-12 DIAGNOSIS — Z880 Allergy status to penicillin: Secondary | ICD-10-CM

## 2021-09-12 DIAGNOSIS — R6889 Other general symptoms and signs: Secondary | ICD-10-CM | POA: Diagnosis not present

## 2021-09-12 DIAGNOSIS — I6389 Other cerebral infarction: Secondary | ICD-10-CM | POA: Diagnosis not present

## 2021-09-12 LAB — COMPREHENSIVE METABOLIC PANEL
ALT: 12 U/L (ref 0–44)
AST: 18 U/L (ref 15–41)
Albumin: 3.5 g/dL (ref 3.5–5.0)
Alkaline Phosphatase: 53 U/L (ref 38–126)
Anion gap: 6 (ref 5–15)
BUN: 14 mg/dL (ref 8–23)
CO2: 25 mmol/L (ref 22–32)
Calcium: 9.2 mg/dL (ref 8.9–10.3)
Chloride: 108 mmol/L (ref 98–111)
Creatinine, Ser: 1 mg/dL (ref 0.44–1.00)
GFR, Estimated: 56 mL/min — ABNORMAL LOW (ref >60)
Glucose, Bld: 110 mg/dL — ABNORMAL HIGH (ref 70–99)
Potassium: 4 mmol/L (ref 3.5–5.1)
Sodium: 139 mmol/L (ref 135–145)
Total Bilirubin: 1 mg/dL (ref 0.3–1.2)
Total Protein: 6.2 g/dL — ABNORMAL LOW (ref 6.5–8.1)

## 2021-09-12 LAB — CBC WITH DIFFERENTIAL/PLATELET
Abs Immature Granulocytes: 0.03 10*3/uL (ref 0.00–0.07)
Basophils Absolute: 0.1 10*3/uL (ref 0.0–0.1)
Basophils Relative: 1 %
Eosinophils Absolute: 0.1 10*3/uL (ref 0.0–0.5)
Eosinophils Relative: 1 %
HCT: 43.2 % (ref 36.0–46.0)
Hemoglobin: 14.3 g/dL (ref 12.0–15.0)
Immature Granulocytes: 0 %
Lymphocytes Relative: 16 %
Lymphs Abs: 1.3 10*3/uL (ref 0.7–4.0)
MCH: 30.8 pg (ref 26.0–34.0)
MCHC: 33.1 g/dL (ref 30.0–36.0)
MCV: 93.1 fL (ref 80.0–100.0)
Monocytes Absolute: 0.4 10*3/uL (ref 0.1–1.0)
Monocytes Relative: 6 %
Neutro Abs: 6.1 10*3/uL (ref 1.7–7.7)
Neutrophils Relative %: 76 %
Platelets: 217 10*3/uL (ref 150–400)
RBC: 4.64 MIL/uL (ref 3.87–5.11)
RDW: 13.2 % (ref 11.5–15.5)
WBC: 8 10*3/uL (ref 4.0–10.5)
nRBC: 0 % (ref 0.0–0.2)

## 2021-09-12 LAB — URINALYSIS, ROUTINE W REFLEX MICROSCOPIC
Bacteria, UA: NONE SEEN
Bilirubin Urine: NEGATIVE
Glucose, UA: NEGATIVE mg/dL
Ketones, ur: NEGATIVE mg/dL
Leukocytes,Ua: NEGATIVE
Nitrite: NEGATIVE
Protein, ur: NEGATIVE mg/dL
Specific Gravity, Urine: 1.009 (ref 1.005–1.030)
pH: 7 (ref 5.0–8.0)

## 2021-09-12 LAB — RESP PANEL BY RT-PCR (FLU A&B, COVID) ARPGX2
Influenza A by PCR: NEGATIVE
Influenza B by PCR: NEGATIVE
SARS Coronavirus 2 by RT PCR: NEGATIVE

## 2021-09-12 LAB — PROTIME-INR
INR: 0.9 (ref 0.8–1.2)
Prothrombin Time: 11.8 seconds (ref 11.4–15.2)

## 2021-09-12 MED ORDER — HEPARIN SODIUM (PORCINE) 5000 UNIT/ML IJ SOLN
5000.0000 [IU] | Freq: Two times a day (BID) | INTRAMUSCULAR | Status: DC
  Administered 2021-09-12 – 2021-09-14 (×4): 5000 [IU] via SUBCUTANEOUS
  Filled 2021-09-12 (×4): qty 1

## 2021-09-12 MED ORDER — ASPIRIN EC 81 MG PO TBEC: 81.0000 mg | DELAYED_RELEASE_TABLET | Freq: Every day | ORAL | Status: DC

## 2021-09-12 MED ORDER — CLOPIDOGREL BISULFATE 300 MG PO TABS
300.0000 mg | ORAL_TABLET | Freq: Once | ORAL | Status: AC
  Administered 2021-09-12: 300 mg via ORAL
  Filled 2021-09-12: qty 1

## 2021-09-12 MED ORDER — STROKE: EARLY STAGES OF RECOVERY BOOK
Freq: Once | Status: AC
  Filled 2021-09-12 (×2): qty 1

## 2021-09-12 MED ORDER — ASPIRIN EC 81 MG PO TBEC
81.0000 mg | DELAYED_RELEASE_TABLET | Freq: Every day | ORAL | Status: DC
  Administered 2021-09-13 – 2021-09-14 (×2): 81 mg via ORAL
  Filled 2021-09-12 (×3): qty 1

## 2021-09-12 MED ORDER — IOHEXOL 350 MG/ML SOLN
80.0000 mL | Freq: Once | INTRAVENOUS | Status: AC | PRN
  Administered 2021-09-12: 80 mL via INTRAVENOUS

## 2021-09-12 MED ORDER — CLOPIDOGREL BISULFATE 75 MG PO TABS
75.0000 mg | ORAL_TABLET | Freq: Every day | ORAL | Status: DC
  Administered 2021-09-13 – 2021-09-14 (×2): 75 mg via ORAL
  Filled 2021-09-12 (×2): qty 1

## 2021-09-12 MED ORDER — HYDRALAZINE HCL 20 MG/ML IJ SOLN: 5.0000 mg | Freq: Four times a day (QID) | INTRAMUSCULAR | Status: DC | PRN

## 2021-09-12 MED ORDER — SODIUM CHLORIDE 0.9 % IV SOLN: INTRAVENOUS | Status: AC

## 2021-09-12 MED ORDER — ASPIRIN 81 MG PO CHEW
81.0000 mg | CHEWABLE_TABLET | Freq: Once | ORAL | Status: AC
  Administered 2021-09-12: 81 mg via ORAL
  Filled 2021-09-12: qty 1

## 2021-09-12 MED ORDER — ACETAMINOPHEN 650 MG RE SUPP: 650.0000 mg | RECTAL | Status: DC | PRN

## 2021-09-12 MED ORDER — SENNOSIDES-DOCUSATE SODIUM 8.6-50 MG PO TABS: 1.0000 | ORAL_TABLET | Freq: Every evening | ORAL | Status: DC | PRN

## 2021-09-12 MED ORDER — ACETAMINOPHEN 160 MG/5ML PO SOLN: 650.0000 mg | ORAL | Status: DC | PRN

## 2021-09-12 MED ORDER — ACETAMINOPHEN 325 MG PO TABS: 650.0000 mg | ORAL_TABLET | ORAL | Status: DC | PRN

## 2021-09-12 MED ORDER — HYDRALAZINE HCL 20 MG/ML IJ SOLN
5.0000 mg | Freq: Four times a day (QID) | INTRAMUSCULAR | Status: DC | PRN
  Administered 2021-09-13: 5 mg via INTRAVENOUS
  Filled 2021-09-12: qty 1

## 2021-09-12 NOTE — ED Notes (Signed)
Walked patient to the bathroom patient did well patient is now back in bed on the monitor with family at bedside

## 2021-09-12 NOTE — ED Provider Notes (Signed)
[facility] EMERGENCY DEPARTMENT Provider Note   CSN: [id number] Arrival date & time: 09/12/21  1159     History No chief complaint on file.   Tammie Carter is a 80 y.o. female.  80 year old female with prior medical history as detailed below presents for evaluation.  Patient reports that yesterday afternoon around noon when she was working on a computer she felt weak and heavy in her left arm and left leg.  She reports that the symptoms have not resolved.  She denies prior history of stroke.  She denies associated chest pain or shortness of breath.  The history is provided by the patient.  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, ER/PR +, HER 2 -   History of radiation therapy 02/15/12 to 04/03/12   left breast   Hx of migraines    Hypertension    Lupus ([place])    Osteoporosis    Thyroglossal duct cyst     Patient Active Problem List   Diagnosis Date Noted   Axillary lump 07/17/2012   Lupus (systemic lupus erythematosus) ([place]) 11/05/2011   Hypertension 11/05/2011   Osteoporosis 11/05/2011   Tobacco abuse, 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   History of thyroglossal duct cyst removal 11/05/2011   Cancer of upper-outer quadrant of female breast ([place]) 09/22/2011   Breast cancer ([place]) 09/14/2011    Past Surgical History:  Procedure Laterality Date   BREAST LUMPECTOMY  12/31/11   L breast, sn bx, node positive, ER/PR+, hER 2 NEG   CESAREAN SECTION  1964, 1968   x2   TUBAL LIGATION       OB History     Gravida  4   Para  4   Term      Preterm      AB      Living         SAB      IAB      Ectopic      Multiple      Live Births           Obstetric Comments  Menarche age 31, 53st pregnancy age 27,  menopause age 59, no HRT         Family History  Problem Relation Age of Onset   Breast cancer Sister 35       now age 74 stage iv   Cancer Sister        breast   Heart disease Mother    Heart disease Father    Stroke Father    Cancer Sister        breast, age 99    Social History   Tobacco Use   Smoking status: Former    Packs/day: 2.00    Years: 50.00    Pack years: 100.00    Types: Cigarettes    Quit date: 05/09/2011    Years since quitting: 10.3   Tobacco comments:    still smokes occass  Substance Use Topics   Alcohol use: No   Drug use: No    Home Medications Prior to Admission medications   Medication Sig Start Date End Date Taking? Authorizing Provider  lisinopril (PRINIVIL,ZESTRIL) 10 MG tablet Take 10 mg by mouth daily.  [provider]  non-metallic deodorant Jethro Poling) MISC Apply 1 application topically daily as needed.    [provider]  predniSONE (DELTASONE) 1 MG tablet Take 1 mg by mouth as needed.      [provider]    Allergies    Codeine, Morphine and related, Tamoxifen, Erythromycin, Letrozole, Penicillins, and Penicillins cross reactors  Review of Systems   Review of Systems  All other systems reviewed and are negative.  Physical Exam Updated Vital Signs BP (!) 166/84 (BP Location: Left Arm)   Pulse 64   Temp 98.2 F (36.8 C) (Oral)   Resp 14   Ht 4\' 11"  (1.499 m)   Wt 43.1 kg   SpO2 97%   BMI 19.19 kg/m   Physical Exam Vitals and nursing note reviewed.  Constitutional:      General: She is not in acute distress.    Appearance: Normal appearance. She is well-developed.  HENT:     Head: Normocephalic and atraumatic.  Eyes:     Conjunctiva/sclera: Conjunctivae normal.     Pupils: Pupils are equal, round, and reactive to light.  Cardiovascular:     Rate and Rhythm: Normal rate and regular rhythm.     Heart sounds: Normal heart sounds.  Pulmonary:     Effort: Pulmonary effort is normal. No  respiratory distress.     Breath sounds: Normal breath sounds.  Abdominal:     General: There is no distension.     Palpations: Abdomen is soft.     Tenderness: There is no abdominal tenderness.  Musculoskeletal:        General: No deformity. Normal range of motion.     Cervical back: Normal range of motion and neck supple.  Skin:    General: Skin is warm and dry.  Neurological:     Mental Status: She is alert and oriented to person, place, and time.     Comments: AO x 3  Mildly slurred speech  Left facial droop  LUE/LLE weakness    ED Results / Procedures / Treatments   Labs (all labs ordered are listed, but only abnormal results are displayed) Labs Reviewed  RESP PANEL BY RT-PCR (FLU A&B, COVID) ARPGX2  URINALYSIS, ROUTINE W REFLEX MICROSCOPIC  CBC WITH DIFFERENTIAL/PLATELET  PROTIME-INR  COMPREHENSIVE METABOLIC PANEL    EKG None  Radiology No results found.  Procedures Procedures   Medications Ordered in ED Medications - No data to display  ED Course  I have reviewed the triage vital signs and the nursing notes.  Pertinent labs & imaging results that were available during my care of the patient were reviewed by me and considered in my medical decision making (see chart for details).    MDM Rules/Calculators/A&P                           MDM  MSE complete  Tammie Carter was evaluated in Emergency Department on 09/12/2021 for the symptoms described in the history of present illness. She was evaluated in the context of the global COVID-19 pandemic, which necessitated consideration that the patient might be at risk for infection with the SARS-CoV-2 virus that causes COVID-19. Institutional protocols and algorithms that pertain to the evaluation of patients at risk for COVID-19 are in a state of rapid change based on information released by regulatory bodies including the CDC and federal and state organizations. These policies and algorithms were followed  during the patient's care in  the ED.  Patient is presenting with left arm and left leg weakness.  Weakness began 24 hours prior to arrival.  MRI is demonstrative of acute infarct.  Neuro is aware of case and will consult.  Hospitalist Roosevelt Locks) service will evaluate for admission.    Final Clinical Impression(s) / ED Diagnoses Final diagnoses:  Cerebrovascular accident (CVA), unspecified mechanism ([place])    Rx / DC Orders ED Discharge Orders     None        Valarie Merino, MD 09/12/21 1515

## 2021-09-12 NOTE — ED Notes (Addendum)
Notified Dr. Roosevelt Locks and Dr. Cyd Silence notified that pt needs diet order

## 2021-09-12 NOTE — ED Notes (Signed)
Patient transported to CT 

## 2021-09-12 NOTE — ED Notes (Signed)
Got patient undressed on the monitor did vitals done ekg shown to Dr Francia Greaves patient is resting with call bell in reach got patient some warm blankets

## 2021-09-12 NOTE — ED Triage Notes (Signed)
Pt BIB [facility] with reports of Left side weakness starting at 12pm yesterday. AOx4,, Weakness noted to LUE and LLE, mouth droop noted

## 2021-09-12 NOTE — ED Notes (Signed)
Patient transported to MRI 

## 2021-09-12 NOTE — Consult Note (Signed)
Neurology Consultation  Reason for Consult: Left-sided weakness, acute right pontine infarct Referring Physician: Dr. Francia Greaves  CC: Left-sided weakness  History is obtained from: Patient, patient's husband at bedside, chart review  HPI: Tammie Carter is a 80 y.o. female with a medical history significant for left breast cancer s/p radiation therapy, lupus, tobacco use, essential hypertension, and migraines who presented to the ED for evaluation of left arm and leg weakness since 9/23.  Patient states that she was working on her computer around noon yesterday when her left leg began to feel weak and heavy.  That afternoon around 3 PM she noticed that her left arm was weak.  She drove her husband to a doctor's appointment when he noted that she had slurred speech, was driving erratically, and when they got home noticed that she had abnormal gait leaning leftward with short steps.  Her blood pressure was significantly elevated at that time.  Patient would not allow him to call 911 for further evaluation yesterday.  This morning at 3 AM patient woke up from bed and attempted to ambulate to the restroom but was staggering and fell to the floor.  She was unable to get up and her husband had to help her get back to bed prompting her to come to ED for further evaluation.  CT imaging was negative for intracranial findings, however, an MRI brain was obtained revealing an acute infarct within the right pons.  LKW: 09/11/2021 12:00 TNK given?: no, patient is outside of time window for thrombolytic therapy IR Thrombectomy? No, presentation is not consistent with an LVO.  Modified Rankin Scale: 0-Completely asymptomatic and back to baseline post- stroke  ROS: A complete ROS was performed and is negative except as noted in the HPI.   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, ER/PR +, HER 2 -   History of radiation therapy 02/15/12 to 04/03/12    left breast   Hx of migraines    Hypertension    Lupus ([place])    Osteoporosis    Thyroglossal duct cyst    Past Surgical History:  Procedure Laterality Date   BREAST LUMPECTOMY  12/31/11   L breast, sn bx, node positive, ER/PR+, hER 2 NEG   CESAREAN SECTION  1964, 1968   x2   TUBAL LIGATION     Family History  Problem Relation Age of Onset   Breast cancer Sister 61       now age 68 stage iv   Cancer Sister        breast   Heart disease Mother    Heart disease Father    Stroke Father    Cancer Sister        breast, age 27   Social History:   reports that she quit smoking about 10 years ago. Her smoking use included cigarettes. She has a 100.00 pack-year smoking history. She does not have any smokeless tobacco history on file. She reports that she does not drink alcohol and does not use drugs.  Medications  Current Facility-Administered Medications:    [START ON 09/13/2021] aspirin EC tablet 81 mg, 81 mg, Oral, Daily, Wynetta Fines T, MD  Current Outpatient Medications:    lisinopril (PRINIVIL,ZESTRIL) 10 MG tablet, Take 10 mg by mouth daily as needed (as directed for elevated blood pressure)., Disp: , Rfl:    predniSONE (DELTASONE) 1 MG tablet, Take 1 mg by mouth daily as needed (  for Lupus flares)., Disp: , Rfl:   Exam: Current vital signs: BP (!) 199/87   Pulse 64   Temp 98.2 F (36.8 C) (Oral)   Resp 16   Ht 4\' 11"  (1.499 m)   Wt 43.1 kg   SpO2 98%   BMI 19.19 kg/m  Vital signs in last 24 hours: Temp:  [98.2 F (36.8 C)] 98.2 F (36.8 C) (09/24 1226) Pulse Rate:  [57-73] 70 (09/24 1545) Resp:  [14-22] 22 (09/24 1545) BP: (165-210)/(74-136) 210/91 (09/24 1545) SpO2:  [95 %-99 %] 98 % (09/24 1545) Weight:  [43.1 kg] 43.1 kg (09/24 1225)  GENERAL: Awake, alert, in no acute distress Psych: Patient is initially inconsolable when admission is recommended but after further discussion, she is calm and cooperative throughout examination and is agreeable for full  stroke work up.  Head: Normocephalic and atraumatic, without obvious abnormality EENT: Normal conjunctivae, dry mucous membranes, no OP obstruction. Patient is edentulous. LUNGS: Normal respiratory effort. Non-labored breathing on room air, SpO2 98% on cardiac monitor CV: Regular rate and rhythm on telemetry ABDOMEN: Soft, non-tender, non-distended Extremities: warm, well perfused, without obvious deformity  NEURO:  Mental Status: Awake, alert, and oriented to person, place, time, and situation. She is able to provide a clear and coherent history of present illness. Speech/Language: speech is dysarthric, however, family states that her speech is at baseline due to her edentulous state.  Naming, repetition, fluency, and comprehension intact without aphasia. No neglect is noted. Cranial Nerves:  II: PERRL 3 mm/brisk. Visual fields full.  III, IV, VI: EOMI with saccadic pursuits. She does not have a gaze preference.  V: Sensation is intact to light touch and symmetrical to face.   VII: Face is mildly asymmetric with less elevation of left mouth with smiling. (Per RN at bedside, face asymmetry was previously more pronounced on arrival) VIII: Hearing is intact to voice IX, X: Palate elevation is symmetric. Phonation normal.  XI: Normal sternocleidomastoid and trapezius muscle strength XII: Tongue protrudes midline without fasciculations.   Motor: Right upper and lower extremity have 5/5 strength without vertical drift. Left upper extremity is with minimal pronator drift on assessment.  Left lower extremity immediately drifts to bed with passive elevation but she is able to initiate brief and minimal antigravity movement following initial drift to bed.  Tone is normal. Bulk is normal.  Sensation: Intact to light touch bilaterally in all four extremities. No extinction to DSS present.  Coordination: FTN and HKS intact on the right, FTN and HKS on the left is proportional to her extremity  weakness without obvious dysmetria.  DTRs: 2+ and symmetric patellae, biceps, and brachioradialis.  Gait: Deferred  NIHSS: 1a Level of Conscious.: 0 1b LOC Questions: 0 1c LOC Commands: 0 2 Best Gaze: 0 3 Visual: 0 4 Facial Palsy: 1 5a Motor Arm - left: 1 5b Motor Arm - Right: 0 6a Motor Leg - Left: 2 6b Motor Leg - Right: 0 7 Limb Ataxia: 0 8 Sensory: 0 9 Best Language: 0 10 Dysarthria: 1 11 Extinct. and Inatten.: 0 TOTAL: 5  Labs I have reviewed labs in epic and the results pertinent to this consultation are: CBC    Component Value Date/Time   WBC 8.0 09/12/2021 1327   RBC 4.64 09/12/2021 1327   HGB 14.3 09/12/2021 1327   HGB 12.9 08/02/2012 0808   HCT 43.2 09/12/2021 1327   HCT 38.4 08/02/2012 0808   PLT 217 09/12/2021 1327   PLT 163 08/02/2012 0808  MCV 93.1 09/12/2021 1327   MCV 95.4 08/02/2012 0808   MCH 30.8 09/12/2021 1327   MCHC 33.1 09/12/2021 1327   RDW 13.2 09/12/2021 1327   RDW 13.6 08/02/2012 0808   LYMPHSABS 1.3 09/12/2021 1327   LYMPHSABS 1.2 08/02/2012 0808   MONOABS 0.4 09/12/2021 1327   MONOABS 0.5 08/02/2012 0808   EOSABS 0.1 09/12/2021 1327   EOSABS 0.1 08/02/2012 0808   BASOSABS 0.1 09/12/2021 1327   BASOSABS 0.0 08/02/2012 0808   CMP     Component Value Date/Time   NA 139 09/12/2021 1327   K 4.0 09/12/2021 1327   CL 108 09/12/2021 1327   CO2 25 09/12/2021 1327   GLUCOSE 110 (H) 09/12/2021 1327   BUN 14 09/12/2021 1327   CREATININE 1.00 09/12/2021 1327   CALCIUM 9.2 09/12/2021 1327   PROT 6.2 (L) 09/12/2021 1327   ALBUMIN 3.5 09/12/2021 1327   AST 18 09/12/2021 1327   ALT 12 09/12/2021 1327   ALKPHOS 53 09/12/2021 1327   BILITOT 1.0 09/12/2021 1327   GFRNONAA 56 (L) 09/12/2021 1327   GFRAA 53 (L) 12/30/2011 1100   Lipid Panel  No results found for: CHOL, TRIG, HDL, CHOLHDL, VLDL, LDLCALC, LDLDIRECT No results found for: HGBA1C  Imaging I have reviewed the images obtained:  CT-scan of the brain 09/12/2021: 1. No acute  intracranial findings. 2. Chronic microvascular ischemic change and cerebral volume loss  MRI examination of the brain 09/12/2021: Mildly motion degraded examination. Acute infarct within the right pons measuring 12 x 10 mm in transaxial dimensions. Moderate chronic small vessel ischemic changes within the cerebral white matter. Mild generalized cerebral atrophy  Assessment: 80 year old female who presents with left-sided lower > upper extremity weakness since noon on 9/23 with MRI imaging of an acute infarct within the right pons. -Examination reveals patient with slurred speech (baseline per patient's family due to edentulous state), left lower extremity weakness > left upper extremity weakness, and mild left facial droop.  Her initial NIHSS is 5. -CT head is without acute intracranial findings. MRI imaging was obtained revealing an acute infarct within the right pons measuring 12 x 10 mm with moderate chronic small vessel ischemic changes within the cerebral white matter. - Stroke risk factors include patient's advanced age, history of and current tobacco use, hypertension, and history of lupus.   -Etiology likely hypertensive.  Patient with systolic blood pressure of >210 mmHG during assessment with reports of hypertension at home. Will complete stroke work-up for further evaluation of etiology.  Impression: Acute right pontine infarct Left hemiparesis   Recommendations: - HgbA1c, fasting lipid panel- statin therapy for LDL goal of < 70  - CT angio head and neck - Frequent neuro checks - Echocardiogram - Prophylactic therapy- Antiplatelet med: Aspirin - dose 325mg  PO or 300mg  PR followed by 81 mg PO daily in addition to clopidogrel 300 mg PO once and 75 mg PO daily  - Permissive hypertension, treat systolic blood pressure > 220 mmHg - Risk factor modification - Discussed smoking cessation, will need ongoing education - Telemetry monitoring - PT consult, OT consult, Speech consult -  Stroke team to follow  Pt seen by NP/Neuro and later by MD. Note/plan to be edited by MD as needed.  Anibal Henderson, AGAC-NP [facility] Pager: [phone]  Attending Neurohospitalist Addendum Patient seen and examined with APP/Resident. Agree with the history and physical as documented above. Agree with the plan as documented, which I helped formulate. I have independently reviewed the chart, obtained  history, review of systems and examined the patient.I have personally reviewed pertinent head/neck/spine imaging (CT/MRI). Please feel free to call with any questions.  -- Amie Portland, MD Neurologist [facility] Pager: [phone]

## 2021-09-12 NOTE — ED Notes (Signed)
Sheral Flow, daughter - [phone]. Please call when pt gets bed

## 2021-09-12 NOTE — ED Notes (Signed)
Provider at bedside

## 2021-09-12 NOTE — ED Notes (Signed)
Got patient back on the monitor patient is resting with family at bedside

## 2021-09-12 NOTE — H&P (Signed)
History and Physical    DANIQUA CAMPOY OZD:[id number] DOB: 01-Mar-1941 DOA: 09/12/2021  PCP: Shon Baton, MD (Confirm with patient/family/NH records and if not entered, this has to be entered at [facility] point of entry) Patient coming from: Home  I have personally briefly reviewed patient's old medical records in [place]  Chief Complaint: Left sided weakness.  HPI: Tammie Carter is a 80 y.o. female with medical history significant of breast cancer status post lumpectomy and radiation therapy, HTN noncompliant with medications, lupus, presented with new onset of left-sided weakness.  Symptoms started yesterday afternoon, started to feel left arm and leg weakness, wobbly when walking, no falls, no numbness.  Did not want to come to the hospital.  Overnight, symptoms persisted, and family noticed her voice was low and slow, and brought her into ED.  She has been noncompliant with her BP medications, takes "when I think I need it".  ED Course: CT head negative, MRI showed acute infarct within the right pons.  Review of Systems: As per HPI otherwise 14 point review of systems negative.    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 Her smoking use included cigarettes. She has a 100.00 pack-year smoking history. She does not have any smokeless tobacco history on file. She reports that she does not drink alcohol and does not use drugs.  Allergies  Allergen Reactions   Codeine Anaphylaxis   Erythromycin  Shortness Of Breath and Other (See Comments)    Patient reports reaction to ALL antibiotics   Morphine And Related Shortness Of Breath and Nausea And Vomiting   Other Shortness Of Breath and Other (See Comments)    The patient stated she cannot take any antibiotics or stronger pain meds   Penicillins Anaphylaxis and Shortness Of Breath   Penicillins Cross Reactors Anaphylaxis and Shortness Of Breath   Valium [Diazepam] Shortness Of Breath and Other (See Comments)    Very severe breathing issues   Tamoxifen Other (See Comments)    Made patient feel depressed, fatigued,  and caused bone and joint pain    Letrozole Other (See Comments)    Made the pt's muscles and bones ache    Family History  Problem Relation Age of Onset   Breast cancer Sister 38       now age 4 stage iv   Cancer Sister        breast   Heart disease Mother    Heart disease Father    Stroke Father    Cancer Sister        breast, age 64     Prior to Admission medications   Medication Sig Start Date End Date Taking? Authorizing Provider  lisinopril (PRINIVIL,ZESTRIL) 10 MG tablet Take 10 mg by mouth daily as needed (as directed for elevated blood pressure).   Yes [provider]  predniSONE (DELTASONE) 1 MG tablet Take 1 mg by mouth daily as needed (for Lupus flares).   Yes [provider]    Physical Exam: Vitals:   09/12/21 1430 09/12/21 1445 09/12/21 1515 09/12/21 1545  BP: (!) 199/87 (!) 194/80 (!) 187/79 (!) 210/91  Pulse: 64 (!) 57 64 70  Resp: 16 16 17  (!) 22  Temp:      TempSrc:      SpO2: 98% 98% 99% 98%  Weight:      Height:        Constitutional: NAD, calm, comfortable Vitals:   09/12/21 1430 09/12/21 1445 09/12/21 1515 09/12/21 1545  BP: (!) 199/87 (!) 194/80 (!) 187/79 (!) 210/91  Pulse: 64 (!) 57 64 70  Resp: 16 16 17  (!) 22  Temp:      TempSrc:      SpO2: 98% 98% 99% 98%  Weight:      Height:       Eyes: PERRL, lids and conjunctivae normal ENMT: Mucous  membranes are moist. Posterior pharynx clear of any exudate or lesions.Normal dentition.  Neck: normal, supple, no masses, no thyromegaly Respiratory: clear to auscultation bilaterally, no wheezing, no crackles. Normal respiratory effort. No accessory muscle use.  Cardiovascular: Regular rate and rhythm, no murmurs / rubs / gallops. No extremity edema. 2+ pedal pulses. No carotid bruits.  Abdomen: no tenderness, no masses palpated. No hepatosplenomegaly. Bowel sounds positive.  Musculoskeletal: no clubbing / cyanosis. No joint deformity upper and lower extremities. Good ROM, no contractures. Normal muscle tone.  Skin: no rashes, lesions, ulcers. No induration Neurologic: Left-sided tongue deviation. Sensation intact, DTR normal. Strength 4/5 in left arm and leg compared to 5/5 on the right side.  Psychiatric: Normal judgment and insight. Alert and oriented x 3. Normal mood.     Labs on Admission: I have personally reviewed following labs and imaging studies  CBC: Recent Labs  Lab 09/12/21 1327  WBC 8.0  NEUTROABS 6.1  HGB 14.3  HCT 43.2  MCV 93.1  PLT 086   Basic Metabolic Panel: Recent Labs  Lab 09/12/21 1327  NA 139  K 4.0  CL 108  CO2 25  GLUCOSE 110*  BUN 14  CREATININE 1.00  CALCIUM 9.2   GFR: Estimated Creatinine Clearance: 29.5 mL/min (by C-G formula based on SCr of 1 mg/dL). Liver Function Tests: Recent Labs  Lab 09/12/21 1327  AST 18  ALT 12  ALKPHOS 53  BILITOT 1.0  PROT 6.2*  ALBUMIN 3.5   No results for input(s): LIPASE, AMYLASE in the last 168 hours. No results for input(s): AMMONIA in the last 168 hours. Coagulation Profile: Recent Labs  Lab 09/12/21 1327  INR 0.9   Cardiac Enzymes: No results for input(s): CKTOTAL, CKMB, CKMBINDEX, TROPONINI in the last 168 hours. BNP (last 3 results) No results for input(s): PROBNP in the last 8760 hours. HbA1C: No results for input(s): HGBA1C in the last 72 hours. CBG: No results for input(s): GLUCAP  in the last 168 hours. Lipid Profile: No results for input(s): CHOL, HDL, LDLCALC, TRIG, CHOLHDL, LDLDIRECT in the last 72 hours. Thyroid Function Tests: No results for input(s): TSH, T4TOTAL, FREET4, T3FREE, THYROIDAB in the last 72 hours. Anemia Panel: No results for input(s): VITAMINB12, FOLATE, FERRITIN, TIBC, IRON, RETICCTPCT in the last 72 hours. Urine analysis:    Component Value Date/Time   COLORURINE YELLOW 09/12/2021 1535   APPEARANCEUR HAZY (A) 09/12/2021 1535   LABSPEC 1.009 09/12/2021 1535   PHURINE 7.0  09/12/2021 [place] 09/12/2021 1535   HGBUR SMALL (A) 09/12/2021 1535   BILIRUBINUR NEGATIVE 09/12/2021 1535   KETONESUR NEGATIVE 09/12/2021 1535   PROTEINUR NEGATIVE 09/12/2021 1535   NITRITE NEGATIVE 09/12/2021 1535   LEUKOCYTESUR NEGATIVE 09/12/2021 1535    Radiological Exams on Admission: CT Head Wo Contrast  Result Date: 09/12/2021 CLINICAL DATA:  Left-sided weakness EXAM: CT HEAD WITHOUT CONTRAST TECHNIQUE: Contiguous axial images were obtained from the base of the skull through the vertex without intravenous contrast. COMPARISON:  None. FINDINGS: Brain: No evidence of acute infarction, hemorrhage, hydrocephalus, extra-axial collection or mass lesion/mass effect. Moderate low-density changes within the periventricular and subcortical white matter compatible with chronic microvascular ischemic change. Mild diffuse cerebral volume loss. Vascular: Atherosclerotic calcifications involving the large vessels of the skull base. No unexpected hyperdense vessel. Skull: Normal. Negative for fracture or focal lesion. Sinuses/Orbits: No acute finding. Other: None. IMPRESSION: 1. No acute intracranial findings. 2. Chronic microvascular ischemic change and cerebral volume loss. Electronically Signed   By: Davina Poke D.O.   On: 09/12/2021 13:01   MR BRAIN WO CONTRAST  Result Date: 09/12/2021 CLINICAL DATA:  Neuro deficit, acute, stroke suspected. Additional  provided: Left-sided weakness beginning at 12 p.m. yesterday. EXAM: MRI HEAD WITHOUT CONTRAST TECHNIQUE: Multiplanar, multiecho pulse sequences of the brain and surrounding structures were obtained without intravenous contrast. COMPARISON:  Head CT 09/12/2021. FINDINGS: Brain: Mild intermittent motion degradation. Mild generalized cerebral atrophy. Acute infarct within the right pons measuring 12 x 10 mm in transaxial dimensions (series 2, image 17). Moderate multifocal T2/FLAIR hyperintense signal abnormality within the cerebral white matter, nonspecific but compatible with chronic small vessel ischemic disease. No evidence of an intracranial mass. No chronic intracranial blood products. No extra-axial fluid collection. No midline shift. Vascular: Maintained flow voids within the proximal large arterial vessels. Skull and upper cervical spine: No focal suspicious marrow lesion. Sinuses/Orbits: Visualized orbits show no acute finding. Trace bilateral ethmoid sinus mucosal thickening. IMPRESSION: Mildly motion degraded examination. Acute infarct within the right pons measuring 12 x 10 mm in transaxial dimensions. Moderate chronic small vessel ischemic changes within the cerebral white matter. Mild generalized cerebral atrophy. Electronically Signed   By: Kellie Simmering D.O.   On: 09/12/2021 14:25   DG Chest Port 1 View  Result Date: 09/12/2021 CLINICAL DATA:  Left-sided weakness. EXAM: PORTABLE CHEST 1 VIEW COMPARISON:  None. FINDINGS: The heart size and mediastinal contours are within normal limits. Both lungs are clear. The visualized skeletal structures are unremarkable. IMPRESSION: No active disease. Electronically Signed   By: Marijo Conception M.D.   On: 09/12/2021 14:35    EKG: Independently reviewed. Sinus, borderline prolonged QTCs.  Assessment/Plan Active Problems:   CVA (cerebral vascular accident) ([place])  (please populate well all problems here in Problem List. (For example, if patient is on BP meds  at home and you resume or decide to hold them, it is a problem that needs to be her. Same for CAD, COPD, HLD and so on)   Acute right pons infarct -with left-sided paresis and impaired left limbs coordination, acute aphasia and possible dysphagia -Likely related to poorly controlled hypertension from noncompliant. -ASA, check lipid panel and A1c. -NPO, speech evaluation -Permissive hypertension for 2 days. -PT OT, likely will need rehab.  HTN uncontrolled -From noncompliance, as needed hydralazine for home.  SLE -No acute issue.  DVT prophylaxis: Heparin subQ Code Status: DNR Family Communication: Husband at bedside Disposition Plan: Expect more than 2 midnight hospital  stay, likely will need SNF Consults called: Neurology Admission status: Telemetry admission   Lequita Halt MD [facility] Pager [phone]  09/12/2021, 4:14 PM

## 2021-09-13 ENCOUNTER — Inpatient Hospital Stay (HOSPITAL_COMMUNITY): Payer: Medicare HMO

## 2021-09-13 DIAGNOSIS — R7303 Prediabetes: Secondary | ICD-10-CM | POA: Diagnosis not present

## 2021-09-13 DIAGNOSIS — I10 Essential (primary) hypertension: Secondary | ICD-10-CM | POA: Diagnosis not present

## 2021-09-13 DIAGNOSIS — I6389 Other cerebral infarction: Secondary | ICD-10-CM

## 2021-09-13 DIAGNOSIS — I639 Cerebral infarction, unspecified: Secondary | ICD-10-CM | POA: Diagnosis not present

## 2021-09-13 LAB — COMPREHENSIVE METABOLIC PANEL
ALT: 12 U/L (ref 0–44)
AST: 18 U/L (ref 15–41)
Albumin: 3.6 g/dL (ref 3.5–5.0)
Alkaline Phosphatase: 54 U/L (ref 38–126)
Anion gap: 9 (ref 5–15)
BUN: 10 mg/dL (ref 8–23)
CO2: 23 mmol/L (ref 22–32)
Calcium: 9.2 mg/dL (ref 8.9–10.3)
Chloride: 107 mmol/L (ref 98–111)
Creatinine, Ser: 0.87 mg/dL (ref 0.44–1.00)
GFR, Estimated: >60 mL/min (ref >60)
Glucose, Bld: 113 mg/dL — ABNORMAL HIGH (ref 70–99)
Potassium: 3.8 mmol/L (ref 3.5–5.1)
Sodium: 139 mmol/L (ref 135–145)
Total Bilirubin: 1 mg/dL (ref 0.3–1.2)
Total Protein: 6.2 g/dL — ABNORMAL LOW (ref 6.5–8.1)

## 2021-09-13 LAB — CBC WITH DIFFERENTIAL/PLATELET
Abs Immature Granulocytes: 0.02 10*3/uL (ref 0.00–0.07)
Basophils Absolute: 0 10*3/uL (ref 0.0–0.1)
Basophils Relative: 1 %
Eosinophils Absolute: 0.1 10*3/uL (ref 0.0–0.5)
Eosinophils Relative: 1 %
HCT: 43.4 % (ref 36.0–46.0)
Hemoglobin: 14.8 g/dL (ref 12.0–15.0)
Immature Granulocytes: 0 %
Lymphocytes Relative: 16 %
Lymphs Abs: 1.4 10*3/uL (ref 0.7–4.0)
MCH: 30.9 pg (ref 26.0–34.0)
MCHC: 34.1 g/dL (ref 30.0–36.0)
MCV: 90.6 fL (ref 80.0–100.0)
Monocytes Absolute: 0.4 10*3/uL (ref 0.1–1.0)
Monocytes Relative: 5 %
Neutro Abs: 6.7 10*3/uL (ref 1.7–7.7)
Neutrophils Relative %: 77 %
Platelets: 217 10*3/uL (ref 150–400)
RBC: 4.79 MIL/uL (ref 3.87–5.11)
RDW: 13.1 % (ref 11.5–15.5)
WBC: 8.7 10*3/uL (ref 4.0–10.5)
nRBC: 0 % (ref 0.0–0.2)

## 2021-09-13 LAB — PHOSPHORUS: Phosphorus: 2.2 mg/dL — ABNORMAL LOW (ref 2.5–4.6)

## 2021-09-13 LAB — LIPID PANEL
Cholesterol: 204 mg/dL — ABNORMAL HIGH (ref 0–200)
HDL: 80 mg/dL (ref >40)
LDL Cholesterol: 109 mg/dL — ABNORMAL HIGH (ref 0–99)
Total CHOL/HDL Ratio: 2.6 RATIO
Triglycerides: 74 mg/dL (ref <150)
VLDL: 15 mg/dL (ref 0–40)

## 2021-09-13 LAB — ECHOCARDIOGRAM COMPLETE
Height: 59 in
S' Lateral: 2.2 cm
Single Plane A2C EF: 43.4 %
Weight: 1520 oz

## 2021-09-13 LAB — MAGNESIUM: Magnesium: 1.8 mg/dL (ref 1.7–2.4)

## 2021-09-13 LAB — HEMOGLOBIN A1C
Hgb A1c MFr Bld: 6 % — ABNORMAL HIGH (ref 4.8–5.6)
Mean Plasma Glucose: 125.5 mg/dL

## 2021-09-13 MED ORDER — ATORVASTATIN CALCIUM 40 MG PO TABS
40.0000 mg | ORAL_TABLET | Freq: Every day | ORAL | Status: DC
  Administered 2021-09-13 – 2021-09-14 (×2): 40 mg via ORAL
  Filled 2021-09-13 (×3): qty 1

## 2021-09-13 NOTE — Evaluation (Addendum)
Speech Language Pathology Evaluation Patient Details Name: Tammie Carter MRN: [id number] DOB: [birth year] Today's Date: 09/13/2021 Time: 2440-1027 SLP Time Calculation (min) (ACUTE ONLY): 16 min  Problem List:  Patient Active Problem List   Diagnosis Date Noted   CVA (cerebral vascular accident) (Tammie Carter) 09/12/2021   Axillary lump 07/17/2012   Lupus (systemic lupus erythematosus) ([place]) 11/05/2011   Hypertension 11/05/2011   Osteoporosis 11/05/2011   Tobacco abuse, 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   History of thyroglossal duct cyst removal 11/05/2011   Cancer of upper-outer quadrant of female breast ([place]) 09/22/2011   Breast cancer ([place]) 09/14/2011   Past Medical History:  Past Medical History:  Diagnosis Date   Breast cancer ([place]) 09/14/11   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  PMHx includes  left breast cancer s/p radiation therapy, lupus, tobacco use, essential hypertension, and migraines.   Assessment / Plan / Recommendation Clinical Impression  Pt presents with a mild dysarthria of speech (not attributable to absence of dentures) c/b consonant distortions/imprecisions; however, speech is intelligible.  Cognitive processes of attention, memory, executive functioning are [facility].  Pt may benefit from f/u SLP to address dysarthria if it has not resolved at time of D/C. Pt/husband agree.    SLP Assessment  SLP Recommendation/Assessment: All  further Speech Lanaguage Pathology  needs can be addressed in the next venue of care SLP Visit Diagnosis: Dysarthria and anarthria (R47.1)    Recommendations for follow up therapy are one component of a multi-disciplinary discharge planning process, led by the attending physician.  Recommendations may be updated based on patient status, additional functional criteria and insurance authorization.    Follow Up Recommendations     SLP evaluation next venue of care (dependent upon OT/PT recs for disposition)  Frequency and Duration           SLP Evaluation Cognition  Overall Cognitive Status: Within Functional Limits for tasks assessed Arousal/Alertness: Awake/alert Orientation Level: Oriented X4 Attention: Selective Selective Attention: Appears intact Memory: Appears intact Safety/Judgment: Appears intact       Comprehension  Auditory Comprehension Overall Auditory Comprehension: Appears within functional limits for tasks assessed Reading Comprehension Reading Status: Within funtional limits    Expression Expression Primary Mode of Expression: Verbal Verbal Expression Overall Verbal Expression: Appears within functional limits for tasks assessed Written Expression Dominant Hand: Right   Oral / Motor  Oral Motor/Sensory Function Overall Oral Motor/Sensory Function: Mild impairment Facial Symmetry: Abnormal symmetry left;Suspected CN VII (facial) dysfunction Lingual Symmetry: Abnormal symmetry left;Suspected CN XII (hypoglossal) dysfunction Motor Speech Overall Motor Speech: Impaired Respiration: Within functional limits Phonation: Normal Resonance: Within functional limits Articulation: Impaired Level of Impairment: Control and instrumentation engineer: Witnin functional limits   [employer] L[employer]  Tivis Ringer, [place] CCC/SLP Acute Rehabilitation Services Office number [phone] Pager [phone]  [name] 09/13/2021, 11:04 AM

## 2021-09-13 NOTE — ED Notes (Signed)
Pt transported to echocardiogram

## 2021-09-13 NOTE — Progress Notes (Addendum)
PROGRESS NOTE    Tammie Carter  SWN:[id number] DOB: [birth year] DOA: 09/12/2021 PCP: Shon Baton, MD  Brief Narrative:  HPI per Dr. Wynetta Fines on 09/12/2021 HPI: Tammie Carter is a 80 y.o. female with medical history significant of breast cancer status post lumpectomy and radiation therapy, HTN noncompliant with medications, lupus, presented with new onset of left-sided weakness.   Symptoms started yesterday afternoon, started to feel left arm and leg weakness, wobbly when walking, no falls, no numbness.  Did not want to come to the hospital.  Overnight, symptoms persisted, and family noticed her voice was low and slow, and brought her into ED.  She has been noncompliant with her BP medications, takes "when I think I need it".   ED Course: CT head negative, MRI showed acute infarct within the right pons.   Review of Systems: As per HPI otherwise 14 point review of systems negative.   **Interim history She was admitted for stroke work-up and neurology was consulted.  Assessment & Plan:   Active Problems:   CVA (cerebral vascular accident) ([place])   Acute Right Pons CVA -Presented with new onset Left-sided Weakness -Head CT done and showed no intracranial findings but did show chronic microvascular ischemic changes and cerebral volume loss -CTA of the head and neck showed:  IMPRESSION: CT head:   1. A known acute infarct within the right pons was better appreciated on the brain MRI performed earlier today. 2. Moderate chronic small-vessel ischemic changes within the cerebral white matter. 3. Mild generalized cerebral atrophy.   CTA neck:   1. The common carotid and internal carotid arteries are patent within the neck. Atherosclerotic plaque within the left carotid bifurcation and proximal ICA with resultant 50-60% stenosis of the proximal left ICA. 2. Vertebral arteries patent within the neck. Mild atherosclerotic narrowing of the proximal left vertebral artery. 3.  Multiple thyroid nodules, the largest within the left lobe measuring 1.6 cm. A nonemergent thyroid ultrasound is recommended for further evaluation. 4.  Aortic Atherosclerosis (ICD10-I70.0). 5.  Emphysema (ICD10-J43.9).   CTA head:   1. No intracranial large vessel occlusion. 2. Atherosclerotic plaque within both intracranial internal carotid arteries with no more than mild stenosis. 3. 1-2 mm laterally projecting vascular protrusion arising from the proximal cavernous right ICA, likely reflecting a small aneurysm. 4. 2 mm inferiorly projecting vascular protrusion arising from the supraclinoid right ICA, which may reflect an aneurysm or infundibulum. -MRI done and showed mildly motion degraded examination but did show an acute infarct within the right pons measuring 12 x 10 mm in transaxial dimension as well as moderate chronic small vessel ischemic changes within the cerebral white matter.  There is also mild general cerebral atrophy noted -Echocardiogram done and is pending read -Lipid panel done and showed a total cholesterol/HDL ratio of 2.6, cholesterol level 204, HDL of 80, LDL of 109, triglycerides of 74, VLDL of 15; Atorvastatin 40 mg po Daily added by Neurology -Hemoglobin A1c was 6.0 -Neurology consulted and recommending aspirin 325 mg p.o. daily prior to admission and now on aspirin 81 mg daily and clopidogrel 75 mg daily; they recommended dual antiplatelet therapy for 3 months and then monotherapy unless A. fib is found and they would recommend Eliquis -PT OT and SLP; recommending CIR currently -Patient does not want to go inpatient rehab and wants to go home but will need to discuss with her husband prior to making any decisions given that she is significantly weak -Currently getting IVF with NS at  125 mL/hr and will stop today   HTN -She takes lisinopril at home but has been noncompliant with it -Neurology recommending permissive hypertension and okay blood pressures less  than 220/120 -Continue to monitor blood pressures per protocol -Last blood pressure reading was 175/87 -Continue with hydralazine 5 mg IV every 6 hours as needed for systolic blood pressure greater than 086 or diastolic blood pressure in 100  HLD -Lipid panel done and showed a total cholesterol/HDL ratio of 2.6, cholesterol level 204, HDL of 80, LDL of 109, triglycerides of 74, VLDL of 15 -Atorvastatin 40 mg po daily started   Thyroid nodules -Was found to have multiple thyroid nodules with the greatest being 1.6 cm; will need outpatient follow-up nonemergent ultrasound as well as TFTs -TSH has been added on  Prediabetes -Patient has had hyperglycemia and blood sugar on admission was 110 and repeat this morning was 113 and CMP -Hemoglobin A1c was 6.0 -Continue monitor blood sugars carefully and if necessary will add sensitive NovoLog sliding scale insulin  SLE -No acute Issue   Hx of Left Breast Invasive Ductal Cancer ER/PR +, HER 2- -S/p Lumpectomy and Radiation Therapy   DVT prophylaxis: Heparin 5,000 units sq q8h Code Status: DO NOT RESUSCITATE  Family Communication: Discussed with Husband at bedside  Disposition Plan: PT/OT recommending CIR but patient may end up going home with Home Health  Status is: Inpatient  Remains inpatient appropriate because:Unsafe d/c plan, IV treatments appropriate due to intensity of illness or inability to take PO, and Inpatient level of care appropriate due to severity of illness  Dispo: The patient is from: Home              Anticipated d/c is to: CIR              Patient currently is not medically stable to d/c.   Difficult to place patient No  Consultants:  Neurology  Procedures:  ECHOCARDIOGRAM   Antimicrobials:  Anti-infectives (From admission, onward)    None        Subjective: Seen and examined at bedside and still remains very weak on the left side.  No chest pain or shortness of breath.  Wants to go home.  Denies any  lightheadedness or dizziness.  No other concerns or complaints at this time.  Objective: Vitals:   09/13/21 0145 09/13/21 0200 09/13/21 0400 09/13/21 0801  BP: (!) 211/91 (!) 202/81 (!) 206/94 (!) 195/101  Pulse: 65 68 72 73  Resp: 18 17 18 19   Temp:      TempSrc:      SpO2: 97% 96% 96% 96%  Weight:      Height:       No intake or output data in the 24 hours ending 09/13/21 0941 Filed Weights   09/12/21 1225  Weight: 43.1 kg   Examination: Physical Exam:  Constitutional: Thin elderly Caucasian female currently in no acute distress Eyes: Lids and conjunctivae normal, sclerae anicteric  ENMT: External Ears, Nose appear normal. Grossly normal hearing.  Neck: Appears normal, supple, no cervical masses, normal ROM, no appreciable thyromegaly; no JVD Respiratory: Diminished to auscultation bilaterally, no wheezing, rales, rhonchi or crackles. Normal respiratory effort and patient is not tachypenic. No accessory muscle use.  Unlabored breathing Cardiovascular: RRR, no murmurs / rubs / gallops. S1 and S2 auscultated.  Abdomen: Soft, non-tender, non-distended. Bowel sounds positive.  GU: Deferred. Musculoskeletal: No clubbing / cyanosis of digits/nails. No joint deformity upper and lower extremities. Skin: No rashes, lesions, ulcers.  No induration; Warm and dry.  Neurologic: CN 2-12 grossly intact with no focal deficits.  Diminished strength on the left side compared to right. Romberg sign and cerebellar reflexes not assessed.  Psychiatric: Normal judgment and insight. Alert and oriented x 3. Normal mood and appropriate affect.   Data Reviewed: I have personally reviewed following labs and imaging studies  CBC: Recent Labs  Lab 09/12/21 1327  WBC 8.0  NEUTROABS 6.1  HGB 14.3  HCT 43.2  MCV 93.1  PLT 191   Basic Metabolic Panel: Recent Labs  Lab 09/12/21 1327  NA 139  K 4.0  CL 108  CO2 25  GLUCOSE 110*  BUN 14  CREATININE 1.00  CALCIUM 9.2   GFR: Estimated  Creatinine Clearance: 29.5 mL/min (by C-G formula based on SCr of 1 mg/dL). Liver Function Tests: Recent Labs  Lab 09/12/21 1327  AST 18  ALT 12  ALKPHOS 53  BILITOT 1.0  PROT 6.2*  ALBUMIN 3.5   No results for input(s): LIPASE, AMYLASE in the last 168 hours. No results for input(s): AMMONIA in the last 168 hours. Coagulation Profile: Recent Labs  Lab 09/12/21 1327  INR 0.9   Cardiac Enzymes: No results for input(s): CKTOTAL, CKMB, CKMBINDEX, TROPONINI in the last 168 hours. BNP (last 3 results) No results for input(s): PROBNP in the last 8760 hours. HbA1C: Recent Labs    09/13/21 0359  HGBA1C 6.0*   CBG: No results for input(s): GLUCAP in the last 168 hours. Lipid Profile: Recent Labs    09/13/21 0359  CHOL 204*  HDL 80  LDLCALC 109*  TRIG 74  CHOLHDL 2.6   Thyroid Function Tests: No results for input(s): TSH, T4TOTAL, FREET4, T3FREE, THYROIDAB in the last 72 hours. Anemia Panel: No results for input(s): VITAMINB12, FOLATE, FERRITIN, TIBC, IRON, RETICCTPCT in the last 72 hours. Sepsis Labs: No results for input(s): PROCALCITON, LATICACIDVEN in the last 168 hours.  Recent Results (from the past 240 hour(s))  Resp Panel by RT-PCR (Flu A&B, Covid) Nasopharyngeal Swab     Status: None   Collection Time: 09/12/21  4:19 PM   Specimen: Nasopharyngeal Swab; Nasopharyngeal(NP) swabs in vial transport medium  Result Value Ref Range Status   SARS Coronavirus 2 by RT PCR NEGATIVE NEGATIVE Final    Comment: (NOTE) SARS-CoV-2 target nucleic acids are NOT DETECTED.  The SARS-CoV-2 RNA is generally detectable in upper respiratory specimens during the acute phase of infection. The lowest concentration of SARS-CoV-2 viral copies this assay can detect is 138 copies/mL. A negative result does not preclude SARS-Cov-2 infection and should not be used as the sole basis for treatment or other patient management decisions. A negative result may occur with  improper specimen  collection/handling, submission of specimen other than nasopharyngeal swab, presence of viral mutation(s) within the areas targeted by this assay, and inadequate number of viral copies(<138 copies/mL). A negative result must be combined with clinical observations, patient history, and epidemiological information. The expected result is Negative.  Fact Sheet for Patients:  EntrepreneurPulse.com.au  Fact Sheet for Healthcare Providers:  IncredibleEmployment.be  This test is no t yet approved or cleared by the [place] FDA and  has been authorized for detection and/or diagnosis of SARS-CoV-2 by FDA under an Emergency Use Authorization (EUA). This EUA will remain  in effect (meaning this test can be used) for the duration of the COVID-19 declaration under Section 564(b)(1) of the Act, 21 U.S.C.section 360bbb-3(b)(1), unless the authorization is terminated  or revoked sooner.  Influenza A by PCR NEGATIVE NEGATIVE Final   Influenza B by PCR NEGATIVE NEGATIVE Final    Comment: (NOTE) The Xpert Xpress SARS-CoV-2/FLU/RSV plus assay is intended as an aid in the diagnosis of influenza from Nasopharyngeal swab specimens and should not be used as a sole basis for treatment. Nasal washings and aspirates are unacceptable for Xpert Xpress SARS-CoV-2/FLU/RSV testing.  Fact Sheet for Patients: EntrepreneurPulse.com.au  Fact Sheet for Healthcare Providers: IncredibleEmployment.be  This test is not yet approved or cleared by the [place] FDA and has been authorized for detection and/or diagnosis of SARS-CoV-2 by FDA under an Emergency Use Authorization (EUA). This EUA will remain in effect (meaning this test can be used) for the duration of the COVID-19 declaration under Section 564(b)(1) of the Act, 21 U.S.C. section 360bbb-3(b)(1), unless the authorization is terminated or revoked.  Performed at [facility], [place] [address], [place], [place] [address]     RN Pressure Injury Documentation:     Estimated body mass index is 19.19 kg/m as calculated from the following:   Height as of this encounter: 4\' 11"  (1.499 m).   Weight as of this encounter: 43.1 kg.  Malnutrition Type:   Malnutrition Characteristics:   Nutrition Interventions:    Radiology Studies: CT ANGIO HEAD NECK W WO CM  Result Date: 09/12/2021 CLINICAL DATA:  Stroke/TIA, assess intracranial arteries. EXAM: CT ANGIOGRAPHY HEAD AND NECK TECHNIQUE: Multidetector CT imaging of the head and neck was performed using the standard protocol during bolus administration of intravenous contrast. Multiplanar CT image reconstructions and MIPs were obtained to evaluate the vascular anatomy. Carotid stenosis measurements (when applicable) are obtained utilizing NASCET criteria, using the distal internal carotid diameter as the denominator. CONTRAST:  72mL OMNIPAQUE IOHEXOL 350 MG/ML SOLN COMPARISON:  Brain MRI 09/12/2021. FINDINGS: CT HEAD FINDINGS Brain: Mild generalized cerebral atrophy. Moderate patchy and ill-defined hypoattenuation within the cerebral white matter, nonspecific but compatible with chronic small vessel ischemic disease. A known acute infarct within the right pons was better appreciated on the brain MRI performed earlier today. There is no acute intracranial hemorrhage. No demarcated cortical infarct. No extra-axial fluid collection. No evidence of an intracranial mass. No midline shift. Vascular: Reported below. Skull: Normal. Negative for fracture or focal lesion. Sinuses: No significant paranasal sinus disease at the imaged levels. Orbits: No mass or acute finding. Review of the MIP images confirms the above findings CTA NECK FINDINGS Aortic arch: Standard aortic branching. Atherosclerotic plaque within the visualized aortic arch and proximal major branch vessels of the neck. No hemodynamically significant innominate or  proximal subclavian artery stenosis. Right carotid system: CCA and ICA patent within the neck without stenosis or significant atherosclerotic disease. Left carotid system: CCA and ICA patent within the neck. Soft and calcified plaque within the carotid bifurcation and proximal ICA. Resultant stenosis within the proximal ICA estimated at 50-60% Vertebral arteries: Vertebral arteries patent within the neck. The left vertebral artery is dominant. Mild atherosclerotic narrowing of the proximal V1 left vertebral artery. Nonstenotic atherosclerotic plaque elsewhere within the cervical vertebral arteries. Skeleton: Cervical spondylosis. No acute bony abnormality or aggressive osseous lesion. Cervical dextrocurvature with partially imaged thoracic levocurvature. Other neck: Multiple thyroid nodules, the largest measuring 1.6 cm within the left lobe. No cervical lymphadenopathy. Upper chest: No consolidation within the imaged lung apices. Centrilobular emphysema. Review of the MIP images confirms the above findings CTA HEAD FINDINGS Anterior circulation: The intracranial internal carotid arteries are patent. Atherosclerotic plaque within both vessels with no  more than mild stenosis. 1-2 mm laterally projecting vascular protrusion arising from the proximal cavernous right ICA, likely reflecting a small aneurysm (series 11, image 110). 2 mm inferiorly projecting vascular protrusion arising from the supraclinoid right ICA, which may reflect an aneurysm or infundibulum (series 15, image 87). The M1 middle cerebral arteries are patent. No M2 proximal branch occlusion or high-grade proximal stenosis is identified. The anterior cerebral arteries are patent. Posterior circulation: The intracranial vertebral arteries are patent. The basilar artery is patent. The posterior cerebral arteries are patent. Posterior communicating arteries are hypoplastic or absent bilaterally. Venous sinuses: Within the limitations of contrast timing, no  convincing thrombus. Anatomic variants: As described. Review of the MIP images confirms the above findings IMPRESSION: CT head: 1. A known acute infarct within the right pons was better appreciated on the brain MRI performed earlier today. 2. Moderate chronic small-vessel ischemic changes within the cerebral white matter. 3. Mild generalized cerebral atrophy. CTA neck: 1. The common carotid and internal carotid arteries are patent within the neck. Atherosclerotic plaque within the left carotid bifurcation and proximal ICA with resultant 50-60% stenosis of the proximal left ICA. 2. Vertebral arteries patent within the neck. Mild atherosclerotic narrowing of the proximal left vertebral artery. 3. Multiple thyroid nodules, the largest within the left lobe measuring 1.6 cm. A nonemergent thyroid ultrasound is recommended for further evaluation. 4.  Aortic Atherosclerosis (ICD10-I70.0). 5.  Emphysema (ICD10-J43.9). CTA head: 1. No intracranial large vessel occlusion. 2. Atherosclerotic plaque within both intracranial internal carotid arteries with no more than mild stenosis. 3. 1-2 mm laterally projecting vascular protrusion arising from the proximal cavernous right ICA, likely reflecting a small aneurysm. 4. 2 mm inferiorly projecting vascular protrusion arising from the supraclinoid right ICA, which may reflect an aneurysm or infundibulum. Electronically Signed   By: Kellie Simmering D.O.   On: 09/12/2021 18:28   CT Head Wo Contrast  Result Date: 09/12/2021 CLINICAL DATA:  Left-sided weakness EXAM: CT HEAD WITHOUT CONTRAST TECHNIQUE: Contiguous axial images were obtained from the base of the skull through the vertex without intravenous contrast. COMPARISON:  None. FINDINGS: Brain: No evidence of acute infarction, hemorrhage, hydrocephalus, extra-axial collection or mass lesion/mass effect. Moderate low-density changes within the periventricular and subcortical white matter compatible with chronic microvascular ischemic  change. Mild diffuse cerebral volume loss. Vascular: Atherosclerotic calcifications involving the large vessels of the skull base. No unexpected hyperdense vessel. Skull: Normal. Negative for fracture or focal lesion. Sinuses/Orbits: No acute finding. Other: None. IMPRESSION: 1. No acute intracranial findings. 2. Chronic microvascular ischemic change and cerebral volume loss. Electronically Signed   By: Davina Poke D.O.   On: 09/12/2021 13:01   MR BRAIN WO CONTRAST  Result Date: 09/12/2021 CLINICAL DATA:  Neuro deficit, acute, stroke suspected. Additional provided: Left-sided weakness beginning at 12 p.m. yesterday. EXAM: MRI HEAD WITHOUT CONTRAST TECHNIQUE: Multiplanar, multiecho pulse sequences of the brain and surrounding structures were obtained without intravenous contrast. COMPARISON:  Head CT 09/12/2021. FINDINGS: Brain: Mild intermittent motion degradation. Mild generalized cerebral atrophy. Acute infarct within the right pons measuring 12 x 10 mm in transaxial dimensions (series 2, image 17). Moderate multifocal T2/FLAIR hyperintense signal abnormality within the cerebral white matter, nonspecific but compatible with chronic small vessel ischemic disease. No evidence of an intracranial mass. No chronic intracranial blood products. No extra-axial fluid collection. No midline shift. Vascular: Maintained flow voids within the proximal large arterial vessels. Skull and upper cervical spine: No focal suspicious marrow lesion. Sinuses/Orbits: Visualized orbits show no  acute finding. Trace bilateral ethmoid sinus mucosal thickening. IMPRESSION: Mildly motion degraded examination. Acute infarct within the right pons measuring 12 x 10 mm in transaxial dimensions. Moderate chronic small vessel ischemic changes within the cerebral white matter. Mild generalized cerebral atrophy. Electronically Signed   By: Kellie Simmering D.O.   On: 09/12/2021 14:25   DG Chest Port 1 View  Result Date: 09/12/2021 CLINICAL  DATA:  Left-sided weakness. EXAM: PORTABLE CHEST 1 VIEW COMPARISON:  None. FINDINGS: The heart size and mediastinal contours are within normal limits. Both lungs are clear. The visualized skeletal structures are unremarkable. IMPRESSION: No active disease. Electronically Signed   By: Marijo Conception M.D.   On: 09/12/2021 14:35    Scheduled Meds:   stroke: mapping our early stages of recovery book   Does not apply Once   aspirin EC  81 mg Oral Daily   clopidogrel  75 mg Oral Daily   heparin  5,000 Units Subcutaneous Q12H   Continuous Infusions:  sodium chloride 125 mL/hr at 09/13/21 0913    LOS: 1 day   Kerney Elbe, DO [facility] PAGER is on AMION  If 7PM-7AM, please contact night-coverage [email or web address]

## 2021-09-13 NOTE — ED Notes (Signed)
Changed pt's linen, placed a brief and new chux pad, and placed a new purewick on pt.

## 2021-09-13 NOTE — ED Notes (Signed)
Sheral Flow daughter [phone] would like an update on the patient

## 2021-09-13 NOTE — Progress Notes (Signed)
09/13/21 1100  OT Visit Information  Last OT Received On 09/13/21  Assistance Needed +1 (+2 for chair follow to ambulate)  History of Present Illness Pt is 80 yo female who presented to the ED for evaluation of left arm and leg weakness since 9/23. MRI revealed acute right pontine infarct. PMH includes left breast cancer s/p radiation therapy, lupus, tobacco use, essential hypertension, and migraines.  Precautions  Precautions Fall  Precaution Comments L side neglect  Restrictions  Weight Bearing Restrictions No  Home Living  Family/patient expects to be discharged to: Private residence  Living Arrangements Spouse/significant other  Available Help at Discharge Available PRN/intermittently  Type of [place] to enter  Entrance Stairs-Number of Steps 3  Entrance Stairs-Rails Left  Home Layout Two level;Bed/bath upstairs  Alternate Level Stairs-Number of Steps full flight with landing and turn at midpoint  Alternate Level Stairs-Rails Right;Left  Additional Comments daughter used to lived with them, passed away in 06-Jul-2023. Spouse works occasional appraisal job for [employer].   Lives With Spouse  Prior Function  Level of Independence Independent  Communication  Communication No difficulties;Other (comment) (edentulous. pt and family report speech is baseline)  Cognition  Arousal/Alertness Awake/alert  Behavior During Therapy WFL for tasks assessed/performed;Flat affect  Overall Cognitive Status Within Functional Limits for tasks assessed  Upper Extremity Assessment  LUE Deficits / Details 2+/5 grossly. sensation intact.  LUE Sensation decreased proprioception  LUE Coordination decreased fine motor;decreased gross motor  Cervical / Trunk Assessment  Cervical / Trunk Assessment Other exceptions (L truncal weakness)  ADL  Overall ADL's  Needs assistance/impaired  Eating/Feeding Sitting;Moderate assistance  Grooming Moderate assistance;Sitting  Upper Body Bathing  Moderate assistance  Lower Body Bathing Moderate assistance;Sit to/from stand  Upper Body Dressing  Moderate assistance;Sitting  Lower Body Dressing Moderate assistance;Sit to/from Retail buyer Moderate assistance;Stand-pivot;BSC;RW  Toileting- Clothing Manipulation and Hygiene Moderate assistance;Sitting/lateral lean;Sit to/from stand  Functional mobility during ADLs Moderate assistance (BUE support, pivotal and sidesteps only this session)  General ADL Comments Pt compelted bed mobility, sat EOB a few minutes with BUE support. Up to mod A for transfers and to take pivotal/side steps. Discussed L side neglect and some strategies to work on that (guests sit on left side).  Vision- History  Baseline Vision/History 1 Wears glasses  Ability to See in Adequate Light 0 Adequate  Patient Visual Report No change from baseline  Vision- Assessment  Vision Assessment? No apparent visual deficits  Perception  Perception Tested? Yes  Perception Deficits Inattention/neglect  Inattention/Neglect Does not attend to left side of body  Comments needs cueing to attend to left side  Bed Mobility  Overal bed mobility Needs Assistance  Bed Mobility Supine to Sit;Sit to Supine  Supine to sit Mod assist  Sit to supine Min assist  General bed mobility comments Extra time, effortful whole body movements, +rail use, and mod A to steady trunk to come to EOB position. assist to control descent to return to supine.  Transfers  Overall transfer level Needs assistance  Equipment used 1 person hand held assist  Transfers Sit to/from [employer] Transfers  Sit to Stand Mod assist;From elevated surface  Stand pivot transfers Min assist;Mod assist;From elevated surface  General transfer comment assist to powerup and steady on L side. Pt seeks BUE support. Unsteadiness noted during pivotal steps continued to seek BUE support. LLE weakness and buckling noted.  Balance  Overall balance assessment Needs  assistance  Sitting-balance support Bilateral  upper extremity supported;Feet unsupported (due to height of ER bed:pt's height)  Sitting balance-Leahy Scale Poor  Sitting balance - Comments BUE support for static sitting  Standing balance support Bilateral upper extremity supported;During functional activity  Standing balance-Leahy Scale Poor  Standing balance comment BUE support needed for static stand  OT - End of Session  Equipment Utilized During Treatment Other (comment) (+1 HHA)  Activity Tolerance Patient tolerated treatment well  Patient left in bed;with call bell/phone within reach;Other (comment) (with Neurologist)  Nurse Communication Mobility status;Other (comment) (BP readings, RN gave meds)  OT Assessment  OT Recommendation/Assessment Patient needs continued OT Services  OT Visit Diagnosis Unsteadiness on feet (R26.81);Other abnormalities of gait and mobility (R26.89);Ataxia, unspecified (R27.0);Other symptoms and signs involving cognitive function;Other symptoms and signs involving the nervous system (R29.898);Hemiplegia and hemiparesis;History of falling (Z91.81)  Hemiplegia - Right/Left Left  Hemiplegia - dominant/non-dominant Non-Dominant  Hemiplegia - caused by Cerebral infarction  OT Problem List Decreased strength;Decreased range of motion;Decreased activity tolerance;Impaired balance (sitting and/or standing);Decreased coordination;Decreased knowledge of use of DME or AE;Decreased knowledge of precautions;Cardiopulmonary status limiting activity;Impaired tone;Impaired UE functional use  OT Plan  OT Frequency (ACUTE ONLY) Min 2X/week  OT Treatment/Interventions (ACUTE ONLY) Self-care/ADL training;Therapeutic exercise;Neuromuscular education;DME and/or AE instruction;Therapeutic activities;Visual/perceptual remediation/compensation;Patient/family education;Balance training  AM-PAC OT "6 Clicks" Daily Activity Outcome Measure (Version 2)  Help from another person eating  meals? 3  Help from another person taking care of personal grooming? 3  Help from another person toileting, which includes using toliet, bedpan, or urinal? 2  Help from another person bathing (including washing, rinsing, drying)? 2  Help from another person to put on and taking off regular upper body clothing? 3  Help from another person to put on and taking off regular lower body clothing? 2  6 Click Score 15  Progressive Mobility  What is the highest level of mobility based on the progressive mobility assessment? Level 4 (Walks with assist in room) - Balance while marching in place and cannot step forward and back - Complete  Mobility Sit up in bed/chair position for meals;Out of bed to chair with meals  OT Recommendation  Recommendations for Other Services PT consult;Rehab consult;Speech consult  Follow Up Recommendations CIR  OT Equipment Other (comment) (TBD next venue)  Individuals Consulted  Consulted and Agree with Results and Recommendations Patient  Acute Rehab OT Goals  Patient Stated Goal home and independent  OT Goal Formulation With patient  Time For Goal Achievement 09/27/21  Potential to Achieve Goals Good  OT Time Calculation  OT Start Time (ACUTE ONLY) 0947  OT Stop Time (ACUTE ONLY) 1018  OT Time Calculation (min) 31 min  OT General Charges  $OT Visit 1 Visit  OT Evaluation  $OT Eval Moderate Complexity 1 Mod  OT Treatments  $Self Care/Home Management  8-22 mins  Written Expression  Dominant Hand Right    Pt admitted with the above diagnoses and presents with below problem list. Pt will benefit from continued acute OT to address the below listed deficits and maximize independence with basic ADLs prior to venue above. At baseline, pt is independent with ADLs. Pt presents with L side hemiplegia, L side neglect, and impaired balance. Pt currently needs up to mod A with basic ADLs, able to take pivotal and sidesteps with mod A +1 and BUE support. Would need +2  assist for chair follow to safely progress mobility further at this time. Pt eager to regain independence and return home where she lives with  her spouse.   Tyrone Schimke, OT Acute Rehabilitation Services Pager: [phone] Office: [phone]

## 2021-09-13 NOTE — Progress Notes (Addendum)
STROKE TEAM PROGRESS NOTE   INTERVAL HISTORY Her PT/OT are at the bedside.  Stroke work up underway. Pt has some mild left side weakness and rehab team currently recommending CIR. MRI shows a right pontine lacunar stroke.  CT angiogram showed 60% proximal left ICA stenosis.  There is a tiny 1 to 2 mm proximal right cavernous ICA infundibulum versus aneurysm.  LDL cholesterol 109 mg percent.  Hemoglobin A1c 6.0. Vitals:   09/13/21 0400 09/13/21 0801 09/13/21 0900 09/13/21 1000  BP: (!) 206/94 (!) 195/101 (!) 214/93 (!) 201/88  Pulse: 72 73  66  Resp: 18 19  18   Temp:      TempSrc:      SpO2: 96% 96% 97% 96%  Weight:      Height:       CBC:  Recent Labs  Lab 09/12/21 1327 09/13/21 1151  WBC 8.0 8.7  NEUTROABS 6.1 6.7  HGB 14.3 14.8  HCT 43.2 43.4  MCV 93.1 90.6  PLT 217 782   Basic Metabolic Panel:  Recent Labs  Lab 09/12/21 1327 09/13/21 1151  NA 139 139  K 4.0 3.8  CL 108 107  CO2 25 23  GLUCOSE 110* 113*  BUN 14 10  CREATININE 1.00 0.87  CALCIUM 9.2 9.2  MG  --  1.8  PHOS  --  2.2*   Lipid Panel:  Recent Labs  Lab 09/13/21 0359  CHOL 204*  TRIG 74  HDL 80  CHOLHDL 2.6  VLDL 15  LDLCALC 109*   HgbA1c:  Recent Labs  Lab 09/13/21 0359  HGBA1C 6.0*   Urine Drug Screen: No results for input(s): LABOPIA, COCAINSCRNUR, LABBENZ, AMPHETMU, THCU, LABBARB in the last 168 hours.  Alcohol Level No results for input(s): ETH in the last 168 hours.  IMAGING past 24 hours CT ANGIO HEAD NECK W WO CM  Result Date: 09/12/2021 CLINICAL DATA:  Stroke/TIA, assess intracranial arteries. EXAM: CT ANGIOGRAPHY HEAD AND NECK TECHNIQUE: Multidetector CT imaging of the head and neck was performed using the standard protocol during bolus administration of intravenous contrast. Multiplanar CT image reconstructions and MIPs were obtained to evaluate the vascular anatomy. Carotid stenosis measurements (when applicable) are obtained utilizing NASCET criteria, using the distal  internal carotid diameter as the denominator. CONTRAST:  48mL OMNIPAQUE IOHEXOL 350 MG/ML SOLN COMPARISON:  Brain MRI 09/12/2021. FINDINGS: CT HEAD FINDINGS Brain: Mild generalized cerebral atrophy. Moderate patchy and ill-defined hypoattenuation within the cerebral white matter, nonspecific but compatible with chronic small vessel ischemic disease. A known acute infarct within the right pons was better appreciated on the brain MRI performed earlier today. There is no acute intracranial hemorrhage. No demarcated cortical infarct. No extra-axial fluid collection. No evidence of an intracranial mass. No midline shift. Vascular: Reported below. Skull: Normal. Negative for fracture or focal lesion. Sinuses: No significant paranasal sinus disease at the imaged levels. Orbits: No mass or acute finding. Review of the MIP images confirms the above findings CTA NECK FINDINGS Aortic arch: Standard aortic branching. Atherosclerotic plaque within the visualized aortic arch and proximal major branch vessels of the neck. No hemodynamically significant innominate or proximal subclavian artery stenosis. Right carotid system: CCA and ICA patent within the neck without stenosis or significant atherosclerotic disease. Left carotid system: CCA and ICA patent within the neck. Soft and calcified plaque within the carotid bifurcation and proximal ICA. Resultant stenosis within the proximal ICA estimated at 50-60% Vertebral arteries: Vertebral arteries patent within the neck. The left vertebral artery is dominant. Mild  atherosclerotic narrowing of the proximal V1 left vertebral artery. Nonstenotic atherosclerotic plaque elsewhere within the cervical vertebral arteries. Skeleton: Cervical spondylosis. No acute bony abnormality or aggressive osseous lesion. Cervical dextrocurvature with partially imaged thoracic levocurvature. Other neck: Multiple thyroid nodules, the largest measuring 1.6 cm within the left lobe. No cervical  lymphadenopathy. Upper chest: No consolidation within the imaged lung apices. Centrilobular emphysema. Review of the MIP images confirms the above findings CTA HEAD FINDINGS Anterior circulation: The intracranial internal carotid arteries are patent. Atherosclerotic plaque within both vessels with no more than mild stenosis. 1-2 mm laterally projecting vascular protrusion arising from the proximal cavernous right ICA, likely reflecting a small aneurysm (series 11, image 110). 2 mm inferiorly projecting vascular protrusion arising from the supraclinoid right ICA, which may reflect an aneurysm or infundibulum (series 15, image 87). The M1 middle cerebral arteries are patent. No M2 proximal branch occlusion or high-grade proximal stenosis is identified. The anterior cerebral arteries are patent. Posterior circulation: The intracranial vertebral arteries are patent. The basilar artery is patent. The posterior cerebral arteries are patent. Posterior communicating arteries are hypoplastic or absent bilaterally. Venous sinuses: Within the limitations of contrast timing, no convincing thrombus. Anatomic variants: As described. Review of the MIP images confirms the above findings IMPRESSION: CT head: 1. A known acute infarct within the right pons was better appreciated on the brain MRI performed earlier today. 2. Moderate chronic small-vessel ischemic changes within the cerebral white matter. 3. Mild generalized cerebral atrophy. CTA neck: 1. The common carotid and internal carotid arteries are patent within the neck. Atherosclerotic plaque within the left carotid bifurcation and proximal ICA with resultant 50-60% stenosis of the proximal left ICA. 2. Vertebral arteries patent within the neck. Mild atherosclerotic narrowing of the proximal left vertebral artery. 3. Multiple thyroid nodules, the largest within the left lobe measuring 1.6 cm. A nonemergent thyroid ultrasound is recommended for further evaluation. 4.  Aortic  Atherosclerosis (ICD10-I70.0). 5.  Emphysema (ICD10-J43.9). CTA head: 1. No intracranial large vessel occlusion. 2. Atherosclerotic plaque within both intracranial internal carotid arteries with no more than mild stenosis. 3. 1-2 mm laterally projecting vascular protrusion arising from the proximal cavernous right ICA, likely reflecting a small aneurysm. 4. 2 mm inferiorly projecting vascular protrusion arising from the supraclinoid right ICA, which may reflect an aneurysm or infundibulum. Electronically Signed   By: Kellie Simmering D.O.   On: 09/12/2021 18:28   MR BRAIN WO CONTRAST  Result Date: 09/12/2021 CLINICAL DATA:  Neuro deficit, acute, stroke suspected. Additional provided: Left-sided weakness beginning at 12 p.m. yesterday. EXAM: MRI HEAD WITHOUT CONTRAST TECHNIQUE: Multiplanar, multiecho pulse sequences of the brain and surrounding structures were obtained without intravenous contrast. COMPARISON:  Head CT 09/12/2021. FINDINGS: Brain: Mild intermittent motion degradation. Mild generalized cerebral atrophy. Acute infarct within the right pons measuring 12 x 10 mm in transaxial dimensions (series 2, image 17). Moderate multifocal T2/FLAIR hyperintense signal abnormality within the cerebral white matter, nonspecific but compatible with chronic small vessel ischemic disease. No evidence of an intracranial mass. No chronic intracranial blood products. No extra-axial fluid collection. No midline shift. Vascular: Maintained flow voids within the proximal large arterial vessels. Skull and upper cervical spine: No focal suspicious marrow lesion. Sinuses/Orbits: Visualized orbits show no acute finding. Trace bilateral ethmoid sinus mucosal thickening. IMPRESSION: Mildly motion degraded examination. Acute infarct within the right pons measuring 12 x 10 mm in transaxial dimensions. Moderate chronic small vessel ischemic changes within the cerebral white matter. Mild generalized cerebral atrophy. Electronically  Signed   By: Kellie Simmering D.O.   On: 09/12/2021 14:25   DG Chest Port 1 View  Result Date: 09/12/2021 CLINICAL DATA:  Left-sided weakness. EXAM: PORTABLE CHEST 1 VIEW COMPARISON:  None. FINDINGS: The heart size and mediastinal contours are within normal limits. Both lungs are clear. The visualized skeletal structures are unremarkable. IMPRESSION: No active disease. Electronically Signed   By: Marijo Conception M.D.   On: 09/12/2021 14:35    PHYSICAL EXAM GENERAL: Awake, alert pleasant elderly lady, in no acute distress Psych: flat affect Head: Normocephalic and atraumatic, without obvious abnormality EENT: Normal conjunctivae, dry mucous membranes, no OP obstruction. Patient is edentulous. LUNGS: Normal respiratory effort. Non-labored breathing on room air CV: Regular rate and rhythm on telemetry ABDOMEN: Soft, non-tender, non-distended Extremities: warm, well perfused, without obvious deformity   NEURO:  Mental Status: Awake, alert, and oriented to person, place, time. Speech/Language: speech is dysarthric, but she states this is at baseline due to her edentulous state. Naming, repetition, fluency, and comprehension intact without aphasia. Mild left side neglect noted. Cranial Nerves:  II: PERRL 2 mm/brisk. Visual fields full.  III, IV, VI: EOMI with slow saccadic pursuits. She does not have a gaze preference.  V: Sensation is intact to light touch and symmetrical to face.   VII: Face is mildly asymmetric with less elevation of left mouth with smiling.  VIII: Hearing is intact to voice IX, X: Palate elevation is symmetric. Phonation normal.  XI: Normal sternocleidomastoid and trapezius muscle strength XII: Tongue protrudes midline without fasciculations.   Motor: Right upper and lower extremity have 5/5 strength without vertical drift. Left upper extremity is with drift on assessment, grips 4/5  Left lower extremity drifts, bounces in air during full 5 count, but did not hit bed. Tone  and bulk is normal for age. Sensation: Intact to light touch bilaterally in all four extremities. No extinction to DSS present.  Coordination: FTN and HKS intact on the right, FTN and HKS on the left is proportional to her extremity weakness without obvious dysmetria.  DTRs: 2+ and symmetric patellae, biceps, and brachioradialis.  Gait: Deferred  ASSESSMENT/PLAN Ms. LEANETTE EUTSLER is a 80 y.o. female who presents with left-sided lower > upper extremity weakness since noon on 9/23 with MRI imaging of an acute infarct within the right pons.  Stroke:  Right Pontine infarct, likely secondary to small vessel disease   CT head No acute abnormality. Small vessel disease. Atrophy.  CTA head & neck Atherosclerotic plaque in LICA 35-46%, mild athero in L Vert. Incidental thyroid nodules (primary team to f/u with [place]). No LVO. Intracranial athero noted as well. Possible RICA infundibulum noted. MRI  Acute right pons infarct.  2D Echo ejection fraction 55 to 60%. LDL 109 HgbA1c 6.0 VTE prophylaxis - heparin sq    Diet   Diet Heart Room service appropriate? Yes; Fluid consistency: Thin   aspirin 325 mg daily prior to admission, now on aspirin 81 mg daily and clopidogrel 75 mg daily. DAPT x 3 weeks, then aspirin monotherapy, unless Afib found, then would recommend Eliquis.  Therapy recommendations:  CIR Disposition:  pending  Hypertension Home meds:  lisinopril Stable Permissive hypertension (OK if < 220/120) but gradually normalize in 5-7 days Long-term BP goal normotensive  Hyperlipidemia Home meds:  none LDL 109, goal < 70 Add lipitor 40mg  po daily  High intensity statin  Continue statin at discharge  Diabetes type II - no dx HgbA1c 6.0, goal < 7.0 CBGs  No results for input(s): GLUCAP in the last 72 hours.  SSI  Other Stroke Risk Factors Advanced Age >/= 24  Former Cigarette smoker; quit ~35yrs ago Family hx stroke (father)  Other Active Problems Lupus Incidental thyroid  nodules noted on CTA h/n. Will need PCP or primary team to f/u. I added TSH to previous labs.  Hospital day # 1  Desiree Metzger-Cihelka, ARNP-C, ANVP-BC Pager: [phone]   STROKE MD NOTE : I have personally obtained history,examined this patient, reviewed notes, independently viewed imaging studies, participated in medical decision making and plan of care.ROS completed by me personally and pertinent positives fully documented  I have made any additions or clarifications directly to the above note. Agree with note above.  She presented with left hemiparesis secondary to right pontine lacunar infarct from small vessel disease.  Check echocardiogram.  Recommend aspirin and Plavix for 3 weeks followed by aspirin alone and aggressive risk factor modification.  Continue physical Occupational Therapy and likely transfer to inpatient rehab in the next few days.  Greater than 50% time during this 35-minute visit was spent on counseling and coordination of care and discussion with care team and answering questions. Stroke team will sign off.  Kindly call for questions. Antony Contras, MD Medical Director [place] Pager: [phone] 09/13/2021 4:51 PM   To contact Stroke Continuity provider, please refer to [email or web address] After hours, contact General Neurology

## 2021-09-13 NOTE — Progress Notes (Signed)
  Echocardiogram 2D Echocardiogram has been performed.  Tammie Carter 09/13/2021, 2:41 PM

## 2021-09-13 NOTE — Evaluation (Signed)
Physical Therapy Evaluation Patient Details Name: Tammie Carter MRN: [id number] DOB: 1941/11/04 Today's Date: 09/13/2021  History of Present Illness  Pt is 80 yo female who presented to the ED for evaluation of left arm and leg weakness since 9/23. MRI revealed acute right pontine infarct. PMH includes left breast cancer s/p radiation therapy, lupus, tobacco use, essential hypertension, and migraines.  Clinical Impression  Pt presents to PT with deficits in strength, power, gait, balance, endurance, safety awareness. Pt with L sided weakness resulting in imbalance when ambulating and mobilizing. Pt with L foot drag when ambulating as well as lateral instability. Pt will benefit from aggressive mobilization and acute PT services to improve mobility quality and restore independence. Pt expresses the desire to return home at discharge, declining inpatient rehab. PT recommends discharge home with HHPT and use of walker for all out of bed mobility.       Recommendations for follow up therapy are one component of a multi-disciplinary discharge planning process, led by the attending physician.  Recommendations may be updated based on patient status, additional functional criteria and insurance authorization.  Follow Up Recommendations Home health PT;Supervision for mobility/OOB    Equipment Recommendations  None recommended by PT (recommend use of walker)    Recommendations for Other Services       Precautions / Restrictions Precautions Precautions: Fall Restrictions Weight Bearing Restrictions: No      Mobility  Bed Mobility Overal bed mobility: Needs Assistance Bed Mobility: Rolling;Sidelying to Sit Rolling: Supervision Sidelying to sit: Supervision       General bed mobility comments: verbal cues for technique, significant effort and increased time    Transfers Overall transfer level: Needs assistance Equipment used: None Transfers: Sit to/from Stand Sit to Stand: Min  guard            Ambulation/Gait Ambulation/Gait assistance: Min assist Gait Distance (Feet): 100 Feet Assistive device: None Gait Pattern/deviations: Step-through pattern;Decreased dorsiflexion - left Gait velocity: reduced Gait velocity interpretation: <1.8 ft/sec, indicate of risk for recurrent falls General Gait Details: pt with slowed step-through gait, increased lateral sway, L foot drag with fatigue  Stairs            Wheelchair Mobility    Modified Rankin (Stroke Patients Only) Modified Rankin (Stroke Patients Only) Pre-Morbid Rankin Score: No symptoms Modified Rankin: Moderately severe disability     Balance Overall balance assessment: Needs assistance Sitting-balance support: No upper extremity supported;Feet supported Sitting balance-Leahy Scale: Fair     Standing balance support: No upper extremity supported Standing balance-Leahy Scale: Poor Standing balance comment: minA without UE support                             Pertinent Vitals/Pain Pain Assessment: No/denies pain    Home Living Family/patient expects to be discharged to:: Private residence Living Arrangements: Spouse/significant other Available Help at Discharge: Family;Available 24 hours/day Type of Home: House Home Access: Stairs to enter Entrance Stairs-Rails: Left Entrance Stairs-Number of Steps: 3 Home Layout: Two level;Bed/bath upstairs Home Equipment: Walker - 2 wheels;Cane - single point;Bedside commode;Shower seat Additional Comments: daughter used to lived with them, passed away in 06-07-2023. Spouse works occasional appraisal job for [employer].    Prior Function Level of Independence: Independent               Hand Dominance   Dominant Hand: Right    Extremity/Trunk Assessment   Upper Extremity Assessment Upper Extremity Assessment: LUE deficits/detail  LUE Deficits / Details: 4-/5 shoulder flexion, 4/5 elbow flexion and extension, 3/5 grip strength    Lower  Extremity Assessment Lower Extremity Assessment: LLE deficits/detail LLE Deficits / Details: 4-/5 hip flexion, 4+/5 knee extension, 4/5 DF/PF    Cervical / Trunk Assessment Cervical / Trunk Assessment: Normal  Communication   Communication: No difficulties (speech deficits are baseline per family)  Cognition Arousal/Alertness: Awake/alert Behavior During Therapy: WFL for tasks assessed/performed;Flat affect Overall Cognitive Status: Impaired/Different from baseline Area of Impairment: Awareness                           Awareness: Emergent   General Comments: some reduced awareness of deficits and falls risk      General Comments General comments (skin integrity, edema, etc.): VSS on RA    Exercises     Assessment/Plan    PT Assessment Patient needs continued PT services  PT Problem List Decreased strength;Decreased activity tolerance;Decreased balance;Decreased mobility;Decreased safety awareness;Decreased knowledge of precautions       PT Treatment Interventions DME instruction;Gait training;Stair training;Functional mobility training;Therapeutic activities;Therapeutic exercise;Balance training;Neuromuscular re-education;Patient/family education    PT Goals (Current goals can be found in the Care Plan section)  Acute Rehab PT Goals Patient Stated Goal: to go home PT Goal Formulation: With patient/family Time For Goal Achievement: 09/27/21 Potential to Achieve Goals: Good    Frequency Min 4X/week   Barriers to discharge        Co-evaluation               AM-PAC PT "6 Clicks" Mobility  Outcome Measure Help needed turning from your back to your side while in a flat bed without using bedrails?: A Little Help needed moving from lying on your back to sitting on the side of a flat bed without using bedrails?: A Little Help needed moving to and from a bed to a chair (including a wheelchair)?: A Little Help needed standing up from a chair using your  arms (e.g., wheelchair or bedside chair)?: A Little Help needed to walk in hospital room?: A Little Help needed climbing 3-5 steps with a railing? : A Lot 6 Click Score: 17    End of Session   Activity Tolerance: Patient tolerated treatment well Patient left: in bed;with call bell/phone within reach Nurse Communication: Mobility status PT Visit Diagnosis: Unsteadiness on feet (R26.81);Other abnormalities of gait and mobility (R26.89);Muscle weakness (generalized) (M62.81);Other symptoms and signs involving the nervous system (R29.898);Hemiplegia and hemiparesis Hemiplegia - Right/Left: Left Hemiplegia - dominant/non-dominant: Non-dominant Hemiplegia - caused by: Cerebral infarction    Time: 6812-7517 PT Time Calculation (min) (ACUTE ONLY): 27 min   Charges:   PT Evaluation $PT Eval Low Complexity: 1 Low          Zenaida Niece, PT, DPT Acute Rehabilitation Pager: [phone]   Zenaida Niece 09/13/2021, 5:18 PM

## 2021-09-14 ENCOUNTER — Encounter (HOSPITAL_COMMUNITY): Payer: Self-pay | Admitting: Internal Medicine

## 2021-09-14 DIAGNOSIS — N1831 Chronic kidney disease, stage 3a: Secondary | ICD-10-CM | POA: Diagnosis not present

## 2021-09-14 DIAGNOSIS — I639 Cerebral infarction, unspecified: Secondary | ICD-10-CM | POA: Diagnosis not present

## 2021-09-14 LAB — COMPREHENSIVE METABOLIC PANEL
ALT: 9 U/L (ref 0–44)
AST: 14 U/L — ABNORMAL LOW (ref 15–41)
Albumin: 3.2 g/dL — ABNORMAL LOW (ref 3.5–5.0)
Alkaline Phosphatase: 49 U/L (ref 38–126)
Anion gap: 8 (ref 5–15)
BUN: 25 mg/dL — ABNORMAL HIGH (ref 8–23)
CO2: 24 mmol/L (ref 22–32)
Calcium: 9.3 mg/dL (ref 8.9–10.3)
Chloride: 107 mmol/L (ref 98–111)
Creatinine, Ser: 1.17 mg/dL — ABNORMAL HIGH (ref 0.44–1.00)
GFR, Estimated: 47 mL/min — ABNORMAL LOW (ref >60)
Glucose, Bld: 123 mg/dL — ABNORMAL HIGH (ref 70–99)
Potassium: 3.9 mmol/L (ref 3.5–5.1)
Sodium: 139 mmol/L (ref 135–145)
Total Bilirubin: 0.7 mg/dL (ref 0.3–1.2)
Total Protein: 5.9 g/dL — ABNORMAL LOW (ref 6.5–8.1)

## 2021-09-14 LAB — CBC WITH DIFFERENTIAL/PLATELET
Abs Immature Granulocytes: 0.02 10*3/uL (ref 0.00–0.07)
Basophils Absolute: 0.1 10*3/uL (ref 0.0–0.1)
Basophils Relative: 1 %
Eosinophils Absolute: 0.1 10*3/uL (ref 0.0–0.5)
Eosinophils Relative: 1 %
HCT: 40.9 % (ref 36.0–46.0)
Hemoglobin: 13.6 g/dL (ref 12.0–15.0)
Immature Granulocytes: 0 %
Lymphocytes Relative: 19 %
Lymphs Abs: 1.7 10*3/uL (ref 0.7–4.0)
MCH: 30.6 pg (ref 26.0–34.0)
MCHC: 33.3 g/dL (ref 30.0–36.0)
MCV: 92.1 fL (ref 80.0–100.0)
Monocytes Absolute: 0.9 10*3/uL (ref 0.1–1.0)
Monocytes Relative: 9 %
Neutro Abs: 6.5 10*3/uL (ref 1.7–7.7)
Neutrophils Relative %: 70 %
Platelets: 212 10*3/uL (ref 150–400)
RBC: 4.44 MIL/uL (ref 3.87–5.11)
RDW: 13.2 % (ref 11.5–15.5)
WBC: 9.4 10*3/uL (ref 4.0–10.5)
nRBC: 0 % (ref 0.0–0.2)

## 2021-09-14 LAB — TSH: TSH: 2.215 u[IU]/mL (ref 0.350–4.500)

## 2021-09-14 LAB — MAGNESIUM: Magnesium: 1.9 mg/dL (ref 1.7–2.4)

## 2021-09-14 LAB — PHOSPHORUS: Phosphorus: 3.9 mg/dL (ref 2.5–4.6)

## 2021-09-14 MED ORDER — SODIUM CHLORIDE 0.9 % IV SOLN: INTRAVENOUS | Status: DC

## 2021-09-14 MED ORDER — CLOPIDOGREL BISULFATE 75 MG PO TABS: 75.0000 mg | ORAL_TABLET | Freq: Every day | ORAL | 0 refills | Status: DC

## 2021-09-14 MED ORDER — SENNOSIDES-DOCUSATE SODIUM 8.6-50 MG PO TABS: 1.0000 | ORAL_TABLET | Freq: Every evening | ORAL | 0 refills | Status: AC | PRN

## 2021-09-14 MED ORDER — ATORVASTATIN CALCIUM 40 MG PO TABS: 40.0000 mg | ORAL_TABLET | Freq: Every day | ORAL | 0 refills | Status: DC

## 2021-09-14 MED ORDER — ASPIRIN 81 MG PO TBEC: 81.0000 mg | DELAYED_RELEASE_TABLET | Freq: Every day | ORAL | 11 refills | Status: AC

## 2021-09-14 NOTE — Discharge Summary (Signed)
Physician Discharge Summary  Tammie Carter MVE:[id number] DOB: 1941/06/07 DOA: 09/12/2021  PCP: Shon Baton, MD  Admit date: 09/12/2021 Discharge date: 09/14/2021  Admitted From: Home Disposition: Home with [facility] as she has declined CIR and SNF  Recommendations for Outpatient Follow-up:  Follow up with PCP in 1-2 weeks Follow up Neurology within 4 weeks Have thyroid ultrasound done in outpatient setting Please obtain CMP/CBC, Mag, Phos in one week Please follow up on the following pending results:  Home Health: Yes  Equipment/Devices: None    Discharge Condition: Stable  CODE STATUS: FULL CODE  Diet recommendation: Heart Healthy   Brief/Interim Summary: HPI per Dr. Wynetta Fines on 09/12/2021 HPI: Tammie Carter is a 80 y.o. female with medical history significant of breast cancer status post lumpectomy and radiation therapy, HTN noncompliant with medications, lupus, presented with new onset of left-sided weakness.   Symptoms started yesterday afternoon, started to feel left arm and leg weakness, wobbly when walking, no falls, no numbness.  Did not want to come to the hospital.  Overnight, symptoms persisted, and family noticed her voice was low and slow, and brought her into ED.  She has been noncompliant with her BP medications, takes "when I think I need it".   ED Course: CT head negative, MRI showed acute infarct within the right pons.   Review of Systems: As per HPI otherwise 14 point review of systems negative.   **Interim history She was admitted for stroke work-up and stroke work-up was completed.  PT OT recommending CIR but patient declined.  She will go home with home health.  Neurology evaluated and recommended dual antiplatelet therapy for 3 weeks and then followed by aspirin monotherapy.  She is deemed medically stable to be discharged as her stroke work-up is completed and she will need to follow-up with neurology in outpatient setting and have outpatient ultrasound  of the thyroid for her thyroid nodules  Discharge Diagnoses:  Active Problems:   CVA (cerebral vascular accident) ([place])  Acute Right Pons CVA -Presented with new onset Left-sided Weakness -Head CT done and showed no intracranial findings but did show chronic microvascular ischemic changes and cerebral volume loss -CTA of the head and neck showed:  IMPRESSION: CT head:   1. A known acute infarct within the right pons was better appreciated on the brain MRI performed earlier today. 2. Moderate chronic small-vessel ischemic changes within the cerebral white matter. 3. Mild generalized cerebral atrophy.   CTA neck:   1. The common carotid and internal carotid arteries are patent within the neck. Atherosclerotic plaque within the left carotid bifurcation and proximal ICA with resultant 50-60% stenosis of the proximal left ICA. 2. Vertebral arteries patent within the neck. Mild atherosclerotic narrowing of the proximal left vertebral artery. 3. Multiple thyroid nodules, the largest within the left lobe measuring 1.6 cm. A nonemergent thyroid ultrasound is recommended for further evaluation. 4.  Aortic Atherosclerosis (ICD10-I70.0). 5.  Emphysema (ICD10-J43.9).   CTA head:   1. No intracranial large vessel occlusion. 2. Atherosclerotic plaque within both intracranial internal carotid arteries with no more than mild stenosis. 3. 1-2 mm laterally projecting vascular protrusion arising from the proximal cavernous right ICA, likely reflecting a small aneurysm. 4. 2 mm inferiorly projecting vascular protrusion arising from the supraclinoid right ICA, which may reflect an aneurysm or infundibulum. -MRI done and showed mildly motion degraded examination but did show an acute infarct within the right pons measuring 12 x 10 mm in transaxial dimension as well  as moderate chronic small vessel ischemic changes within the cerebral white matter.  There is also mild general cerebral atrophy  noted -Echocardiogram done and is pending read -Lipid panel done and showed a total cholesterol/HDL ratio of 2.6, cholesterol level 204, HDL of 80, LDL of 109, triglycerides of 74, VLDL of 15; Atorvastatin 40 mg po Daily added by Neurology -Hemoglobin A1c was 6.0 -Neurology consulted and recommending aspirin 325 mg p.o. daily prior to admission and now on aspirin 81 mg daily and clopidogrel 75 mg daily; they recommended dual antiplatelet therapy for 3 months and then monotherapy unless A. fib is found and they would recommend Eliquis -PT OT and SLP; recommending CIR currently however patient does not want to go to CIR and elected to go home with home health and she is deemed medically stable to be discharged home -Patient does not want to go inpatient rehab and wants to go home but will need to discuss with her husband prior to making any decisions given that she is significantly weak and she has 24-hour caregiver support so she will go home with home health services -IV fluid hydration resumed prior to discharge  -Follow-up with neurology in 4 weeks and continue dual antiplatelet therapy for 3 weeks and then just monotherapy with aspirin   HTN -She takes lisinopril at home but has been noncompliant with it -Neurology recommending permissive hypertension and okay blood pressures less than 220/120 -Continue to monitor blood pressures per protocol -Last blood pressure reading was 187/90 -Continue with hydralazine 5 mg IV every 6 hours as needed for systolic blood pressure greater than 314 or diastolic blood pressure in 100   HLD -Lipid panel done and showed a total cholesterol/HDL ratio of 2.6, cholesterol level 204, HDL of 80, LDL of 109, triglycerides of 74, VLDL of 15 -Atorvastatin 40 mg po daily started we will continue at discharge   Thyroid nodules -Was found to have multiple thyroid nodules with the greatest being 1.6 cm; will need outpatient follow-up nonemergent ultrasound as well as  TFTs -TSH has been added on was normal at 2.215   Prediabetes -Patient has had hyperglycemia and blood sugar on admission was 110 and repeat this morning was 123 on CMP -Hemoglobin A1c was 6.0 -Continue monitor blood sugars carefully and if necessary will add sensitive NovoLog sliding scale insulin   SLE -No acute Issue  -Takes prednisone as needed for flares   Hx of Left Breast Invasive Ductal Cancer ER/PR +, HER 2- -S/p Lumpectomy and Radiation Therapy   CKD Stage 3a -Cr is around baseline from priors that we have in the Computer -Given IVF Hydration prior to D/C -BUN/Cr went from 14/1.00 -> 10/0.87 -> 25/1.17 -Continue monitor and trend renal function avoid nephrotoxic medication, contrast dyes, hypotension renally dose medications -Repeat CMP within 1 week  Discharge Instructions  Discharge Instructions     Ambulatory referral to Neurology   Complete by: As directed    An appointment is requested in approximately: 4 Weeks   Call MD for:  difficulty breathing, headache or visual disturbances   Complete by: As directed    Call MD for:  extreme fatigue   Complete by: As directed    Call MD for:  hives   Complete by: As directed    Call MD for:  persistant dizziness or light-headedness   Complete by: As directed    Call MD for:  persistant nausea and vomiting   Complete by: As directed    Call MD for:  redness, tenderness, or signs of infection (pain, swelling, redness, odor or green/yellow discharge around incision site)   Complete by: As directed    Call MD for:  severe uncontrolled pain   Complete by: As directed    Call MD for:  temperature >100.4   Complete by: As directed    Diet - low sodium heart healthy   Complete by: As directed    Discharge instructions   Complete by: As directed    You were cared for by a hospitalist during your hospital stay. If you have any questions about your discharge medications or the care you received while you were in the hospital  after you are discharged, you can call the unit and ask to speak with the hospitalist on call if the hospitalist that took care of you is not available. Once you are discharged, your primary care physician will handle any further medical issues. Please note that NO REFILLS for any discharge medications will be authorized once you are discharged, as it is imperative that you return to your primary care physician (or establish a relationship with a primary care physician if you do not have one) for your aftercare needs so that they can reassess your need for medications and monitor your lab values.  Follow up with PCP and Neurology and have Thyroid US done as an outpatient. Take all medications as prescribed. If symptoms change or worsen please return to the ED for evaluation   Increase activity slowly   Complete by: As directed       Allergies as of 09/14/2021       Reactions   Codeine Anaphylaxis   Erythromycin Shortness Of Breath, Other (See Comments)   Patient reports reaction to ALL antibiotics   Morphine And Related Shortness Of Breath, Nausea And Vomiting   Other Shortness Of Breath, Other (See Comments)   The patient stated she cannot take any antibiotics or stronger pain meds   Penicillins Anaphylaxis, Shortness Of Breath   Penicillins Cross Reactors Anaphylaxis, Shortness Of Breath   Valium [diazepam] Shortness Of Breath, Other (See Comments)   Very severe breathing issues   Tamoxifen Other (See Comments)   Made patient feel depressed, fatigued,  and caused bone and joint pain   Letrozole Other (See Comments)   Made the pt's muscles and bones ache        Medication List     TAKE these medications    aspirin 81 MG EC tablet Take 1 tablet (81 mg total) by mouth daily. Swallow whole. Start taking on: September 15, 2021   atorvastatin 40 MG tablet Commonly known as: LIPITOR Take 1 tablet (40 mg total) by mouth daily. Start taking on: September 15, 2021   clopidogrel 75  MG tablet Commonly known as: PLAVIX Take 1 tablet (75 mg total) by mouth daily. Start taking on: September 15, 2021   lisinopril 10 MG tablet Commonly known as: ZESTRIL Take 10 mg by mouth daily as needed (as directed for elevated blood pressure).   predniSONE 1 MG tablet Commonly known as: DELTASONE Take 1 mg by mouth daily as needed (for Lupus flares).   senna-docusate 8.6-50 MG tablet Commonly known as: Senokot-S Take 1 tablet by mouth at bedtime as needed for mild constipation.        Follow-up Information     [facility].   Specialty: [place] Why: [facility] will contact you to schedule your first home visit. Contact information: [address]  [address] [place] [address] [phone]                Allergies  Allergen Reactions   Codeine Anaphylaxis   Erythromycin Shortness Of Breath and Other (See Comments)    Patient reports reaction to ALL antibiotics   Morphine And Related Shortness Of Breath and Nausea And Vomiting   Other Shortness Of Breath and Other (See Comments)    The patient stated she cannot take any antibiotics or stronger pain meds   Penicillins Anaphylaxis and Shortness Of Breath   Penicillins Cross Reactors Anaphylaxis and Shortness Of Breath   Valium [Diazepam] Shortness Of Breath and Other (See Comments)    Very severe breathing issues   Tamoxifen Other (See Comments)    Made patient feel depressed, fatigued,  and caused bone and joint pain    Letrozole Other (See Comments)    Made the pt's muscles and bones ache   Consultations: Neurology  Procedures/Studies: CT ANGIO HEAD NECK W WO CM  Result Date: 09/12/2021 CLINICAL DATA:  Stroke/TIA, assess intracranial arteries. EXAM: CT ANGIOGRAPHY HEAD AND NECK TECHNIQUE: Multidetector CT imaging of the head and neck was performed using the standard protocol during bolus administration of intravenous contrast. Multiplanar CT image reconstructions and  MIPs were obtained to evaluate the vascular anatomy. Carotid stenosis measurements (when applicable) are obtained utilizing NASCET criteria, using the distal internal carotid diameter as the denominator. CONTRAST:  45mL OMNIPAQUE IOHEXOL 350 MG/ML SOLN COMPARISON:  Brain MRI 09/12/2021. FINDINGS: CT HEAD FINDINGS Brain: Mild generalized cerebral atrophy. Moderate patchy and ill-defined hypoattenuation within the cerebral white matter, nonspecific but compatible with chronic small vessel ischemic disease. A known acute infarct within the right pons was better appreciated on the brain MRI performed earlier today. There is no acute intracranial hemorrhage. No demarcated cortical infarct. No extra-axial fluid collection. No evidence of an intracranial mass. No midline shift. Vascular: Reported below. Skull: Normal. Negative for fracture or focal lesion. Sinuses: No significant paranasal sinus disease at the imaged levels. Orbits: No mass or acute finding. Review of the MIP images confirms the above findings CTA NECK FINDINGS Aortic arch: Standard aortic branching. Atherosclerotic plaque within the visualized aortic arch and proximal major branch vessels of the neck. No hemodynamically significant innominate or proximal subclavian artery stenosis. Right carotid system: CCA and ICA patent within the neck without stenosis or significant atherosclerotic disease. Left carotid system: CCA and ICA patent within the neck. Soft and calcified plaque within the carotid bifurcation and proximal ICA. Resultant stenosis within the proximal ICA estimated at 50-60% Vertebral arteries: Vertebral arteries patent within the neck. The left vertebral artery is dominant. Mild atherosclerotic narrowing of the proximal V1 left vertebral artery. Nonstenotic atherosclerotic plaque elsewhere within the cervical vertebral arteries. Skeleton: Cervical spondylosis. No acute bony abnormality or aggressive osseous lesion. Cervical dextrocurvature with  partially imaged thoracic levocurvature. Other neck: Multiple thyroid nodules, the largest measuring 1.6 cm within the left lobe. No cervical lymphadenopathy. Upper chest: No consolidation within the imaged lung apices. Centrilobular emphysema. Review of the MIP images confirms the above findings CTA HEAD FINDINGS Anterior circulation: The intracranial internal carotid arteries are patent. Atherosclerotic plaque within both vessels with no more than mild stenosis. 1-2 mm laterally projecting vascular protrusion arising from the proximal cavernous right ICA, likely reflecting a small aneurysm (series 11, image 110). 2 mm inferiorly projecting vascular protrusion arising from the supraclinoid right ICA, which may reflect an aneurysm or infundibulum (series 15, image 87). The M1 middle cerebral  arteries are patent. No M2 proximal branch occlusion or high-grade proximal stenosis is identified. The anterior cerebral arteries are patent. Posterior circulation: The intracranial vertebral arteries are patent. The basilar artery is patent. The posterior cerebral arteries are patent. Posterior communicating arteries are hypoplastic or absent bilaterally. Venous sinuses: Within the limitations of contrast timing, no convincing thrombus. Anatomic variants: As described. Review of the MIP images confirms the above findings IMPRESSION: CT head: 1. A known acute infarct within the right pons was better appreciated on the brain MRI performed earlier today. 2. Moderate chronic small-vessel ischemic changes within the cerebral white matter. 3. Mild generalized cerebral atrophy. CTA neck: 1. The common carotid and internal carotid arteries are patent within the neck. Atherosclerotic plaque within the left carotid bifurcation and proximal ICA with resultant 50-60% stenosis of the proximal left ICA. 2. Vertebral arteries patent within the neck. Mild atherosclerotic narrowing of the proximal left vertebral artery. 3. Multiple thyroid  nodules, the largest within the left lobe measuring 1.6 cm. A nonemergent thyroid ultrasound is recommended for further evaluation. 4.  Aortic Atherosclerosis (ICD10-I70.0). 5.  Emphysema (ICD10-J43.9). CTA head: 1. No intracranial large vessel occlusion. 2. Atherosclerotic plaque within both intracranial internal carotid arteries with no more than mild stenosis. 3. 1-2 mm laterally projecting vascular protrusion arising from the proximal cavernous right ICA, likely reflecting a small aneurysm. 4. 2 mm inferiorly projecting vascular protrusion arising from the supraclinoid right ICA, which may reflect an aneurysm or infundibulum. Electronically Signed   By: Kellie Simmering D.O.   On: 09/12/2021 18:28   CT Head Wo Contrast  Result Date: 09/12/2021 CLINICAL DATA:  Left-sided weakness EXAM: CT HEAD WITHOUT CONTRAST TECHNIQUE: Contiguous axial images were obtained from the base of the skull through the vertex without intravenous contrast. COMPARISON:  None. FINDINGS: Brain: No evidence of acute infarction, hemorrhage, hydrocephalus, extra-axial collection or mass lesion/mass effect. Moderate low-density changes within the periventricular and subcortical white matter compatible with chronic microvascular ischemic change. Mild diffuse cerebral volume loss. Vascular: Atherosclerotic calcifications involving the large vessels of the skull base. No unexpected hyperdense vessel. Skull: Normal. Negative for fracture or focal lesion. Sinuses/Orbits: No acute finding. Other: None. IMPRESSION: 1. No acute intracranial findings. 2. Chronic microvascular ischemic change and cerebral volume loss. Electronically Signed   By: Davina Poke D.O.   On: 09/12/2021 13:01   MR BRAIN WO CONTRAST  Result Date: 09/12/2021 CLINICAL DATA:  Neuro deficit, acute, stroke suspected. Additional provided: Left-sided weakness beginning at 12 p.m. yesterday. EXAM: MRI HEAD WITHOUT CONTRAST TECHNIQUE: Multiplanar, multiecho pulse sequences of  the brain and surrounding structures were obtained without intravenous contrast. COMPARISON:  Head CT 09/12/2021. FINDINGS: Brain: Mild intermittent motion degradation. Mild generalized cerebral atrophy. Acute infarct within the right pons measuring 12 x 10 mm in transaxial dimensions (series 2, image 17). Moderate multifocal T2/FLAIR hyperintense signal abnormality within the cerebral white matter, nonspecific but compatible with chronic small vessel ischemic disease. No evidence of an intracranial mass. No chronic intracranial blood products. No extra-axial fluid collection. No midline shift. Vascular: Maintained flow voids within the proximal large arterial vessels. Skull and upper cervical spine: No focal suspicious marrow lesion. Sinuses/Orbits: Visualized orbits show no acute finding. Trace bilateral ethmoid sinus mucosal thickening. IMPRESSION: Mildly motion degraded examination. Acute infarct within the right pons measuring 12 x 10 mm in transaxial dimensions. Moderate chronic small vessel ischemic changes within the cerebral white matter. Mild generalized cerebral atrophy. Electronically Signed   By: Kellie Simmering D.O.  On: 09/12/2021 14:25   DG Chest Port 1 View  Result Date: 09/12/2021 CLINICAL DATA:  Left-sided weakness. EXAM: PORTABLE CHEST 1 VIEW COMPARISON:  None. FINDINGS: The heart size and mediastinal contours are within normal limits. Both lungs are clear. The visualized skeletal structures are unremarkable. IMPRESSION: No active disease. Electronically Signed   By: Marijo Conception M.D.   On: 09/12/2021 14:35   ECHOCARDIOGRAM COMPLETE  Result Date: 09/13/2021    ECHOCARDIOGRAM REPORT   Patient Name:   Tammie Carter Date of Exam: 09/13/2021 Medical Rec #:  [id number]       Height:       59.0 in Accession #:    [id number]      Weight:       95.0 lb Date of Birth:  11/11/1941       BSA:          1.344 m Patient Age:    80 years        BP:           178/84 mmHg Patient Gender: F                HR:           61 bpm. Exam Location:  Inpatient Procedure: 2D Echo Indications:    stroke  History:        Patient has no prior history of Echocardiogram examinations.                 Lupus; Risk Factors:Hypertension.  Sonographer:    [place] Referring Phys: [id number] [place]  1. Left ventricular ejection fraction, by estimation, is 55 to 60%. The left ventricle has normal function. The left ventricle has no regional wall motion abnormalities. Left ventricular diastolic parameters are consistent with Grade I diastolic dysfunction (impaired relaxation).  2. Right ventricular systolic function is normal. The right ventricular size is normal.  3. The mitral valve is normal in structure. No evidence of mitral valve regurgitation. No evidence of mitral stenosis.  4. The aortic valve is normal in structure. Aortic valve regurgitation is not visualized. No aortic stenosis is present.  5. The inferior vena cava is normal in size with greater than 50% respiratory variability, suggesting right atrial pressure of 3 mmHg. FINDINGS  Left Ventricle: Left ventricular ejection fraction, by estimation, is 55 to 60%. The left ventricle has normal function. The left ventricle has no regional wall motion abnormalities. The left ventricular internal cavity size was normal in size. There is  no left ventricular hypertrophy. Left ventricular diastolic parameters are consistent with Grade I diastolic dysfunction (impaired relaxation). Right Ventricle: The right ventricular size is normal. No increase in right ventricular wall thickness. Right ventricular systolic function is normal. Left Atrium: Left atrial size was normal in size. Right Atrium: Right atrial size was normal in size. Pericardium: There is no evidence of pericardial effusion. Mitral Valve: The mitral valve is normal in structure. No evidence of mitral valve regurgitation. No evidence of mitral valve stenosis. Tricuspid Valve: The tricuspid valve  is normal in structure. Tricuspid valve regurgitation is not demonstrated. No evidence of tricuspid stenosis. Aortic Valve: The aortic valve is normal in structure. Aortic valve regurgitation is not visualized. No aortic stenosis is present. Pulmonic Valve: The pulmonic valve was normal in structure. Pulmonic valve regurgitation is not visualized. No evidence of pulmonic stenosis. Aorta: The aortic root is normal in size and structure. Venous: The inferior vena cava is normal in  size with greater than 50% respiratory variability, suggesting right atrial pressure of 3 mmHg. IAS/Shunts: No atrial level shunt detected by color flow Doppler.  LEFT VENTRICLE PLAX 2D LVIDd:         3.50 cm     Diastology LVIDs:         2.20 cm     LV e' medial:    6.20 cm/s LV PW:         1.00 cm     LV E/e' medial:  6.4 LV IVS:        1.00 cm     LV e' lateral:   6.42 cm/s LVOT diam:     2.00 cm     LV E/e' lateral: 6.2 LV SV:         52 LV SV Index:   39 LVOT Area:     3.14 cm  LV Volumes (MOD) LV vol d, MOD A2C: 43.8 ml LV vol s, MOD A2C: 24.8 ml LV SV MOD A2C:     19.0 ml RIGHT VENTRICLE             IVC RV S prime:     12.90 cm/s  IVC diam: 1.00 cm TAPSE (M-mode): 2.4 cm LEFT ATRIUM             Index       RIGHT ATRIUM          Index LA diam:        2.50 cm 1.86 cm/m  RA Area:     8.64 cm LA Vol (A2C):   27.1 ml 20.16 ml/m RA Volume:   15.50 ml 11.53 ml/m LA Vol (A4C):   15.5 ml 11.53 ml/m LA Biplane Vol: 20.6 ml 15.33 ml/m  AORTIC VALVE LVOT Vmax:   90.40 cm/s LVOT Vmean:  61.300 cm/s LVOT VTI:    0.167 m  AORTA Ao Root diam: 2.30 cm Ao Asc diam:  2.80 cm MV E velocity: 39.80 cm/s MV A velocity: 86.10 cm/s  SHUNTS MV E/A ratio:  0.46        Systemic VTI:  0.17 m                            Systemic Diam: 2.00 cm Jenkins Rouge MD Electronically signed by Jenkins Rouge MD Signature Date/Time: 09/13/2021/3:38:34 PM    Final      Subjective: Seen and examined at bedside and she was happy that she was going to need to go home given  that neurology is cleared her and that she is of sound mind and has family support at home.  She denies any complaints but remains significantly weak.  No other concerns or complaints at this time and is medically stable to be discharged home at this time   Discharge Exam: Vitals:   09/14/21 0124 09/14/21 0738  BP: (!) 189/90 (!) 187/90  Pulse: 97 76  Resp:  19  Temp:  97.9 F (36.6 C)  SpO2: (!) 87% 98%   Vitals:   09/14/21 0000 09/14/21 0123 09/14/21 0124 09/14/21 0738  BP: (!) 173/82 (!) 189/90 (!) 189/90 (!) 187/90  Pulse: 74 72 97 76  Resp: 19   19  Temp:  97.9 F (36.6 C)  97.9 F (36.6 C)  TempSrc:  Oral    SpO2: 93% 98% (!) 87% 98%  Weight:      Height:       General: Pt is alert,  awake, not in acute distress Cardiovascular: RRR, S1/S2 +, no rubs, no gallops Respiratory: Diminished bilaterally, no wheezing, no rhonchi Abdominal: Soft, NT, ND, bowel sounds + Extremities: no edema, no cyanosis; extreme weakness on her left side compared to the right  The results of significant diagnostics from this hospitalization (including imaging, microbiology, ancillary and laboratory) are listed below for reference.    Microbiology: Recent Results (from the past 240 hour(s))  Resp Panel by RT-PCR (Flu A&B, Covid) Nasopharyngeal Swab     Status: None   Collection Time: 09/12/21  4:19 PM   Specimen: Nasopharyngeal Swab; Nasopharyngeal(NP) swabs in vial transport medium  Result Value Ref Range Status   SARS Coronavirus 2 by RT PCR NEGATIVE NEGATIVE Final    Comment: (NOTE) SARS-CoV-2 target nucleic acids are NOT DETECTED.  The SARS-CoV-2 RNA is generally detectable in upper respiratory specimens during the acute phase of infection. The lowest concentration of SARS-CoV-2 viral copies this assay can detect is 138 copies/mL. A negative result does not preclude SARS-Cov-2 infection and should not be used as the sole basis for treatment or other patient management decisions. A  negative result may occur with  improper specimen collection/handling, submission of specimen other than nasopharyngeal swab, presence of viral mutation(s) within the areas targeted by this assay, and inadequate number of viral copies(<138 copies/mL). A negative result must be combined with clinical observations, patient history, and epidemiological information. The expected result is Negative.  Fact Sheet for Patients:  EntrepreneurPulse.com.au  Fact Sheet for Healthcare Providers:  IncredibleEmployment.be  This test is no t yet approved or cleared by the [place] FDA and  has been authorized for detection and/or diagnosis of SARS-CoV-2 by FDA under an Emergency Use Authorization (EUA). This EUA will remain  in effect (meaning this test can be used) for the duration of the COVID-19 declaration under Section 564(b)(1) of the Act, 21 U.S.C.section 360bbb-3(b)(1), unless the authorization is terminated  or revoked sooner.       Influenza A by PCR NEGATIVE NEGATIVE Final   Influenza B by PCR NEGATIVE NEGATIVE Final    Comment: (NOTE) The Xpert Xpress SARS-CoV-2/FLU/RSV plus assay is intended as an aid in the diagnosis of influenza from Nasopharyngeal swab specimens and should not be used as a sole basis for treatment. Nasal washings and aspirates are unacceptable for Xpert Xpress SARS-CoV-2/FLU/RSV testing.  Fact Sheet for Patients: EntrepreneurPulse.com.au  Fact Sheet for Healthcare Providers: IncredibleEmployment.be  This test is not yet approved or cleared by the [place] FDA and has been authorized for detection and/or diagnosis of SARS-CoV-2 by FDA under an Emergency Use Authorization (EUA). This EUA will remain in effect (meaning this test can be used) for the duration of the COVID-19 declaration under Section 564(b)(1) of the Act, 21 U.S.C. section 360bbb-3(b)(1), unless the authorization  is terminated or revoked.  Performed at [facility], [place] [address], [place], [place] [address]     Labs: BNP (last 3 results) No results for input(s): BNP in the last 8760 hours. Basic Metabolic Panel: Recent Labs  Lab 09/12/21 1327 09/13/21 1151 09/14/21 0414  NA 139 139 139  K 4.0 3.8 3.9  CL 108 107 107  CO2 25 23 24   GLUCOSE 110* 113* 123*  BUN 14 10 25*  CREATININE 1.00 0.87 1.17*  CALCIUM 9.2 9.2 9.3  MG  --  1.8 1.9  PHOS  --  2.2* 3.9   Liver Function Tests: Recent Labs  Lab 09/12/21 1327 09/13/21 1151 09/14/21 0414  AST 18 18 14*  ALT 12 12 9   ALKPHOS 53 54 49  BILITOT 1.0 1.0 0.7  PROT 6.2* 6.2* 5.9*  ALBUMIN 3.5 3.6 3.2*   No results for input(s): LIPASE, AMYLASE in the last 168 hours. No results for input(s): AMMONIA in the last 168 hours. CBC: Recent Labs  Lab 09/12/21 1327 09/13/21 1151 09/14/21 0414  WBC 8.0 8.7 9.4  NEUTROABS 6.1 6.7 6.5  HGB 14.3 14.8 13.6  HCT 43.2 43.4 40.9  MCV 93.1 90.6 92.1  PLT 217 217 212   Cardiac Enzymes: No results for input(s): CKTOTAL, CKMB, CKMBINDEX, TROPONINI in the last 168 hours. BNP: Invalid input(s): POCBNP CBG: No results for input(s): GLUCAP in the last 168 hours. D-Dimer No results for input(s): DDIMER in the last 72 hours. Hgb A1c Recent Labs    09/13/21 0359  HGBA1C 6.0*   Lipid Profile Recent Labs    09/13/21 0359  CHOL 204*  HDL 80  LDLCALC 109*  TRIG 74  CHOLHDL 2.6   Thyroid function studies Recent Labs    09/14/21 0414  TSH 2.215   Anemia work up No results for input(s): VITAMINB12, FOLATE, FERRITIN, TIBC, IRON, RETICCTPCT in the last 72 hours. Urinalysis    Component Value Date/Time   COLORURINE YELLOW 09/12/2021 1535   APPEARANCEUR HAZY (A) 09/12/2021 1535   LABSPEC 1.009 09/12/2021 1535   PHURINE 7.0 09/12/2021 1535   GLUCOSEU NEGATIVE 09/12/2021 1535   HGBUR SMALL (A) 09/12/2021 1535   BILIRUBINUR NEGATIVE 09/12/2021 1535   KETONESUR NEGATIVE  09/12/2021 1535   PROTEINUR NEGATIVE 09/12/2021 1535   NITRITE NEGATIVE 09/12/2021 1535   LEUKOCYTESUR NEGATIVE 09/12/2021 1535   Sepsis Labs Invalid input(s): PROCALCITONIN,  WBC,  LACTICIDVEN Microbiology Recent Results (from the past 240 hour(s))  Resp Panel by RT-PCR (Flu A&B, Covid) Nasopharyngeal Swab     Status: None   Collection Time: 09/12/21  4:19 PM   Specimen: Nasopharyngeal Swab; Nasopharyngeal(NP) swabs in vial transport medium  Result Value Ref Range Status   SARS Coronavirus 2 by RT PCR NEGATIVE NEGATIVE Final    Comment: (NOTE) SARS-CoV-2 target nucleic acids are NOT DETECTED.  The SARS-CoV-2 RNA is generally detectable in upper respiratory specimens during the acute phase of infection. The lowest concentration of SARS-CoV-2 viral copies this assay can detect is 138 copies/mL. A negative result does not preclude SARS-Cov-2 infection and should not be used as the sole basis for treatment or other patient management decisions. A negative result may occur with  improper specimen collection/handling, submission of specimen other than nasopharyngeal swab, presence of viral mutation(s) within the areas targeted by this assay, and inadequate number of viral copies(<138 copies/mL). A negative result must be combined with clinical observations, patient history, and epidemiological information. The expected result is Negative.  Fact Sheet for Patients:  EntrepreneurPulse.com.au  Fact Sheet for Healthcare Providers:  IncredibleEmployment.be  This test is no t yet approved or cleared by the [place] FDA and  has been authorized for detection and/or diagnosis of SARS-CoV-2 by FDA under an Emergency Use Authorization (EUA). This EUA will remain  in effect (meaning this test can be used) for the duration of the COVID-19 declaration under Section 564(b)(1) of the Act, 21 U.S.C.section 360bbb-3(b)(1), unless the authorization is  terminated  or revoked sooner.       Influenza A by PCR NEGATIVE NEGATIVE Final   Influenza B by PCR NEGATIVE NEGATIVE Final    Comment: (NOTE) The Xpert Xpress SARS-CoV-2/FLU/RSV plus assay is intended  as an aid in the diagnosis of influenza from Nasopharyngeal swab specimens and should not be used as a sole basis for treatment. Nasal washings and aspirates are unacceptable for Xpert Xpress SARS-CoV-2/FLU/RSV testing.  Fact Sheet for Patients: EntrepreneurPulse.com.au  Fact Sheet for Healthcare Providers: IncredibleEmployment.be  This test is not yet approved or cleared by the [place] FDA and has been authorized for detection and/or diagnosis of SARS-CoV-2 by FDA under an Emergency Use Authorization (EUA). This EUA will remain in effect (meaning this test can be used) for the duration of the COVID-19 declaration under Section 564(b)(1) of the Act, 21 U.S.C. section 360bbb-3(b)(1), unless the authorization is terminated or revoked.  Performed at [facility], [place] [address], [place], [place] [address]    Time coordinating discharge: 35 minutes  SIGNED:  Kerney Elbe, DO [facility] 09/14/2021, 6:40 PM Pager is on [place]  If 7PM-7AM, please contact night-coverage [email or web address]

## 2021-09-14 NOTE — Plan of Care (Signed)
Problem: Education: Goal: Knowledge of General Education information will improve Description: Including pain rating scale, medication(s)/side effects and non-pharmacologic comfort measures 09/14/2021 1645 by Lysbeth Galas, RN Outcome: Adequate for Discharge 09/14/2021 1643 by Lysbeth Galas, RN Outcome: Adequate for Discharge 09/14/2021 1208 by Lysbeth Galas, RN Outcome: Progressing   Problem: Health Behavior/Discharge Planning: Goal: Ability to manage health-related needs will improve 09/14/2021 1645 by Lysbeth Galas, RN Outcome: Adequate for Discharge 09/14/2021 1643 by Lysbeth Galas, RN Outcome: Adequate for Discharge 09/14/2021 1208 by Lysbeth Galas, RN Outcome: Progressing   Problem: Clinical Measurements: Goal: Ability to maintain clinical measurements within normal limits will improve 09/14/2021 1645 by Lysbeth Galas, RN Outcome: Adequate for Discharge 09/14/2021 1643 by Lysbeth Galas, RN Outcome: Adequate for Discharge 09/14/2021 1208 by Lysbeth Galas, RN Outcome: Progressing Goal: Will remain free from infection 09/14/2021 1645 by Lysbeth Galas, RN Outcome: Adequate for Discharge 09/14/2021 1643 by Lysbeth Galas, RN Outcome: Adequate for Discharge 09/14/2021 1208 by Lysbeth Galas, RN Outcome: Progressing Goal: Diagnostic test results will improve 09/14/2021 1645 by Lysbeth Galas, RN Outcome: Adequate for Discharge 09/14/2021 1643 by Lysbeth Galas, RN Outcome: Adequate for Discharge 09/14/2021 1208 by Lysbeth Galas, RN Outcome: Progressing Goal: Respiratory complications will improve 09/14/2021 1645 by Lysbeth Galas, RN Outcome: Adequate for Discharge 09/14/2021 1643 by Lysbeth Galas, RN Outcome: Adequate for Discharge 09/14/2021 1208 by Lysbeth Galas, RN Outcome: Progressing Goal: Cardiovascular complication will be avoided 09/14/2021 1645 by Lysbeth Galas, RN Outcome: Adequate for Discharge 09/14/2021 1643 by Lysbeth Galas,  RN Outcome: Adequate for Discharge 09/14/2021 1208 by Lysbeth Galas, RN Outcome: Progressing   Problem: Activity: Goal: Risk for activity intolerance will decrease 09/14/2021 1645 by Lysbeth Galas, RN Outcome: Adequate for Discharge 09/14/2021 1643 by Lysbeth Galas, RN Outcome: Adequate for Discharge 09/14/2021 1208 by Lysbeth Galas, RN Outcome: Progressing   Problem: Nutrition: Goal: Adequate nutrition will be maintained 09/14/2021 1645 by Lysbeth Galas, RN Outcome: Adequate for Discharge 09/14/2021 1643 by Lysbeth Galas, RN Outcome: Adequate for Discharge 09/14/2021 1208 by Lysbeth Galas, RN Outcome: Progressing   Problem: Coping: Goal: Level of anxiety will decrease 09/14/2021 1645 by Lysbeth Galas, RN Outcome: Adequate for Discharge 09/14/2021 1643 by Lysbeth Galas, RN Outcome: Adequate for Discharge 09/14/2021 1208 by Lysbeth Galas, RN Outcome: Progressing   Problem: Elimination: Goal: Will not experience complications related to bowel motility 09/14/2021 1645 by Lysbeth Galas, RN Outcome: Adequate for Discharge 09/14/2021 1643 by Lysbeth Galas, RN Outcome: Adequate for Discharge 09/14/2021 1208 by Lysbeth Galas, RN Outcome: Progressing Goal: Will not experience complications related to urinary retention 09/14/2021 1645 by Lysbeth Galas, RN Outcome: Adequate for Discharge 09/14/2021 1643 by Lysbeth Galas, RN Outcome: Adequate for Discharge 09/14/2021 1208 by Lysbeth Galas, RN Outcome: Progressing   Problem: Pain Managment: Goal: General experience of comfort will improve 09/14/2021 1645 by Lysbeth Galas, RN Outcome: Adequate for Discharge 09/14/2021 1643 by Lysbeth Galas, RN Outcome: Adequate for Discharge 09/14/2021 1208 by Lysbeth Galas, RN Outcome: Progressing   Problem: Safety: Goal: Ability to remain free from injury will improve 09/14/2021 1645 by Lysbeth Galas, RN Outcome: Adequate for Discharge 09/14/2021 1643 by Lysbeth Galas, RN Outcome: Adequate for Discharge 09/14/2021 1208 by Lysbeth Galas, RN Outcome: Progressing   Problem: Skin Integrity: Goal: Risk for impaired skin integrity will  decrease 09/14/2021 1645 by Lysbeth Galas, RN Outcome: Adequate for Discharge 09/14/2021 1643 by Lysbeth Galas, RN Outcome: Adequate for Discharge 09/14/2021 1208 by Lysbeth Galas, RN Outcome: Progressing   Problem: Education: Goal: Knowledge of disease or condition will improve 09/14/2021 1645 by Lysbeth Galas, RN Outcome: Adequate for Discharge 09/14/2021 1643 by Lysbeth Galas, RN Outcome: Adequate for Discharge Goal: Knowledge of secondary prevention will improve 09/14/2021 1645 by Lysbeth Galas, RN Outcome: Adequate for Discharge 09/14/2021 1643 by Lysbeth Galas, RN Outcome: Adequate for Discharge

## 2021-09-14 NOTE — Progress Notes (Signed)
Pateint discharged home via private vehicle with family. Home health and follow up appt reviewed with [facility]. Reviewed education in [facility] and Living with Stroke education pamphlet. PIV and tele discontinued and personal belonging home with patient.

## 2021-09-14 NOTE — TOC Initial Note (Signed)
Transition of Care [facility]) - Initial/Assessment Note    Patient Details  Name: Tammie Carter MRN: [id number] Date of Birth: Apr 12, 1941  [facility]) CM/SW Contact:    Joanne Chars, LCSW Phone Number: 09/14/2021, 3:44 PM  Clinical Narrative:   CSW met with pt regarding recommendation for [facility].  Pt agreeable to [facility], choice document given.  Permission given to speak with husband Marcello Moores and daughter Bethena Roys present in room.  Family asked CSW to contact [facility] and [facility].  Both accepted, family then chose [facility].  Current DME in home: walker, wheelchair, cane.  PCP in place. Pt is not vaccinated for covid.    [facility] will provide [facility] services.                 Expected Discharge Plan: [place] Barriers to Discharge: No Barriers Identified   Patient Goals and CMS Choice Patient states their goals for this hospitalization and ongoing recovery are:: "get back to where I was" CMS Medicare.gov Compare Post Acute Care list provided to:: Patient Choice offered to / list presented to : Patient  Expected Discharge Plan and Services Expected Discharge Plan: [place] Choice: [place] arrangements for the past 2 months: Single Family Home Expected Discharge Date: 09/14/21               DME Arranged: N/A         HH Arranged: PT, OT, Nurse's Aide HH Agency: [place] Date [place]: 09/14/21 Time HH Agency Contacted: 86 Representative spoke with at [place]: [place] Arrangements/Services Living arrangements for the past 2 months: [place] with:: Spouse Patient language and need for interpreter reviewed:: Yes Do you feel safe going back to the place where you live?: Yes      Need for Family Participation in Patient Care: Yes (Comment) Care giver support system in place?: Yes (comment) Current home services: Home OT, Home PT Criminal  Activity/Legal Involvement Pertinent to Current Situation/Hospitalization: No - Comment as needed  Activities of Daily Living Home Assistive Devices/Equipment: None ADL Screening (condition at time of admission) Patient's cognitive ability adequate to safely complete daily activities?: Yes Is the patient deaf or have difficulty hearing?: No Does the patient have difficulty seeing, even when wearing glasses/contacts?: No Does the patient have difficulty concentrating, remembering, or making decisions?: No Patient able to express need for assistance with ADLs?: Yes Does the patient have difficulty dressing or bathing?: No Independently performs ADLs?: No Communication: Independent Dressing (OT): Needs assistance Is this a change from baseline?: Change from baseline, expected to last <3days Grooming: Independent Feeding: Independent Is this a change from baseline?: Pre-admission baseline Bathing: Needs assistance Is this a change from baseline?: Change from baseline, expected to last <3 days Toileting: Needs assistance Is this a change from baseline?: Change from baseline, expected to last <3 days In/Out Bed: Needs assistance Is this a change from baseline?: Change from baseline, expected to last <3 days Walks in Home: Appropriate for developmental age Does the patient have difficulty walking or climbing stairs?: Yes Weakness of Legs: Left Weakness of Arms/Hands: Left  Permission Sought/Granted Permission sought to share information with : Family Supports Permission granted to share information with : Yes, Verbal Permission Granted  Share Information with NAME: husband Marcello Moores, daughter Bethena Roys  Permission granted to share info w AGENCY: [facility]        Emotional  Assessment Appearance:: Appears stated age Attitude/Demeanor/Rapport: Engaged Affect (typically observed): Appropriate, Pleasant Orientation: : Oriented to Self, Oriented to Place, Oriented to  Time, Oriented to  Situation Alcohol / Substance Use: Not Applicable Psych Involvement: No (comment)  Admission diagnosis:  CVA (cerebral vascular accident) [facility]) [I63.9] Cerebrovascular accident (CVA), unspecified mechanism ([place]) [I63.9] Patient Active Problem List   Diagnosis Date Noted   CVA (cerebral vascular accident) ([place]) 09/12/2021   Axillary lump 07/17/2012   Lupus (systemic lupus erythematosus) ([place]) 11/05/2011   Hypertension 11/05/2011   Osteoporosis 11/05/2011   Tobacco abuse, 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 [place] [place] [address] Phone: [phone] Fax: [phone]     Social Determinants of Health (SDOH) Interventions    Readmission Risk Interventions No flowsheet data found.

## 2021-09-14 NOTE — Plan of Care (Signed)

## 2021-09-22 DIAGNOSIS — R7302 Impaired glucose tolerance (oral): Secondary | ICD-10-CM | POA: Diagnosis not present

## 2021-09-22 DIAGNOSIS — I129 Hypertensive chronic kidney disease with stage 1 through stage 4 chronic kidney disease, or unspecified chronic kidney disease: Secondary | ICD-10-CM | POA: Diagnosis not present

## 2021-09-22 DIAGNOSIS — E785 Hyperlipidemia, unspecified: Secondary | ICD-10-CM | POA: Diagnosis not present

## 2021-09-22 DIAGNOSIS — N1831 Chronic kidney disease, stage 3a: Secondary | ICD-10-CM | POA: Diagnosis not present

## 2021-09-22 DIAGNOSIS — E041 Nontoxic single thyroid nodule: Secondary | ICD-10-CM | POA: Diagnosis not present

## 2021-09-22 DIAGNOSIS — I671 Cerebral aneurysm, nonruptured: Secondary | ICD-10-CM | POA: Diagnosis not present

## 2021-09-22 DIAGNOSIS — M329 Systemic lupus erythematosus, unspecified: Secondary | ICD-10-CM | POA: Diagnosis not present

## 2021-09-22 DIAGNOSIS — I5189 Other ill-defined heart diseases: Secondary | ICD-10-CM | POA: Diagnosis not present

## 2021-09-22 DIAGNOSIS — I6522 Occlusion and stenosis of left carotid artery: Secondary | ICD-10-CM | POA: Diagnosis not present

## 2021-09-22 DIAGNOSIS — I130 Hypertensive heart and chronic kidney disease with heart failure and stage 1 through stage 4 chronic kidney disease, or unspecified chronic kidney disease: Secondary | ICD-10-CM | POA: Diagnosis not present

## 2021-09-22 DIAGNOSIS — Z853 Personal history of malignant neoplasm of breast: Secondary | ICD-10-CM | POA: Diagnosis not present

## 2021-09-22 DIAGNOSIS — I6389 Other cerebral infarction: Secondary | ICD-10-CM | POA: Diagnosis not present

## 2021-09-23 ENCOUNTER — Other Ambulatory Visit: Payer: Self-pay | Admitting: Family Medicine

## 2021-09-23 DIAGNOSIS — E041 Nontoxic single thyroid nodule: Secondary | ICD-10-CM

## 2021-09-28 DIAGNOSIS — G43909 Migraine, unspecified, not intractable, without status migrainosus: Secondary | ICD-10-CM | POA: Diagnosis not present

## 2021-09-28 DIAGNOSIS — Z853 Personal history of malignant neoplasm of breast: Secondary | ICD-10-CM | POA: Diagnosis not present

## 2021-09-28 DIAGNOSIS — Z7982 Long term (current) use of aspirin: Secondary | ICD-10-CM | POA: Diagnosis not present

## 2021-09-28 DIAGNOSIS — I69354 Hemiplegia and hemiparesis following cerebral infarction affecting left non-dominant side: Secondary | ICD-10-CM | POA: Diagnosis not present

## 2021-09-28 DIAGNOSIS — Z87891 Personal history of nicotine dependence: Secondary | ICD-10-CM | POA: Diagnosis not present

## 2021-09-28 DIAGNOSIS — Z7902 Long term (current) use of antithrombotics/antiplatelets: Secondary | ICD-10-CM | POA: Diagnosis not present

## 2021-09-28 DIAGNOSIS — I10 Essential (primary) hypertension: Secondary | ICD-10-CM | POA: Diagnosis not present

## 2021-09-28 DIAGNOSIS — M329 Systemic lupus erythematosus, unspecified: Secondary | ICD-10-CM | POA: Diagnosis not present

## 2021-09-28 DIAGNOSIS — M810 Age-related osteoporosis without current pathological fracture: Secondary | ICD-10-CM | POA: Diagnosis not present

## 2021-09-28 DIAGNOSIS — I639 Cerebral infarction, unspecified: Secondary | ICD-10-CM | POA: Diagnosis not present

## 2021-09-30 DIAGNOSIS — I10 Essential (primary) hypertension: Secondary | ICD-10-CM | POA: Diagnosis not present

## 2021-09-30 DIAGNOSIS — I671 Cerebral aneurysm, nonruptured: Secondary | ICD-10-CM | POA: Diagnosis not present

## 2021-10-02 DIAGNOSIS — E785 Hyperlipidemia, unspecified: Secondary | ICD-10-CM | POA: Diagnosis not present

## 2021-10-02 DIAGNOSIS — R69 Illness, unspecified: Secondary | ICD-10-CM | POA: Diagnosis not present

## 2021-10-02 DIAGNOSIS — I6990 Unspecified sequelae of unspecified cerebrovascular disease: Secondary | ICD-10-CM | POA: Diagnosis not present

## 2021-10-02 DIAGNOSIS — J449 Chronic obstructive pulmonary disease, unspecified: Secondary | ICD-10-CM | POA: Diagnosis not present

## 2021-10-02 DIAGNOSIS — I130 Hypertensive heart and chronic kidney disease with heart failure and stage 1 through stage 4 chronic kidney disease, or unspecified chronic kidney disease: Secondary | ICD-10-CM | POA: Diagnosis not present

## 2021-10-02 DIAGNOSIS — Z0000 Encounter for general adult medical examination without abnormal findings: Secondary | ICD-10-CM | POA: Diagnosis not present

## 2021-10-02 DIAGNOSIS — I5189 Other ill-defined heart diseases: Secondary | ICD-10-CM | POA: Diagnosis not present

## 2021-10-02 DIAGNOSIS — N1831 Chronic kidney disease, stage 3a: Secondary | ICD-10-CM | POA: Diagnosis not present

## 2021-10-02 DIAGNOSIS — M329 Systemic lupus erythematosus, unspecified: Secondary | ICD-10-CM | POA: Diagnosis not present

## 2021-10-02 DIAGNOSIS — I700 Atherosclerosis of aorta: Secondary | ICD-10-CM | POA: Diagnosis not present

## 2021-10-04 NOTE — Progress Notes (Signed)
Office Note     CC: Left internal carotid artery stenosis Requesting Provider:  Shon Baton, MD  HPI: Tammie Carter is a 80 y.o. (06-07-1941) female presenting at the request of .Shon Baton, MD after presenting at [facility] on 09/12/2021 with a right pontine stroke. Work-up demonstrated greater than 50% stenosis of the left internal carotid artery.  Tammie Carter has a history of  breast cancer status post lumpectomy and radiation therapy, HTN noncompliant with medications, lupus.  On initial presentation to the hospital, Tammie Carter's symptoms included left arm and leg weakness, as well as slowed speech.  These have resolved.  On exam today, Tammie Carter was doing well, accompanied by her husband and daughter.  Sequela from her stroke have resolved.  The pt is  on a statin for cholesterol management.  The pt is  on a daily aspirin.   Other AC:  plavix The pt is is on medication for hypertension.   The pt is not diabetic.  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, ER/PR +, HER 2 -   History of radiation therapy 02/15/12 to 04/03/12   left breast   Hx of migraines    Hypertension    Lupus ([place])    Osteoporosis    Thyroglossal duct cyst     Past Surgical History:  Procedure Laterality Date   BREAST LUMPECTOMY  12/31/11   L breast, sn bx, node positive, ER/PR+, hER 2 NEG   CESAREAN SECTION  1964, 1968   x2   TUBAL LIGATION      Social History   Socioeconomic History   Marital status: Married    Spouse name: Not on file   Number of children: Not on file   Years of education: Not on file   Highest education level: Not on file  Occupational History   Not on file  Tobacco Use   Smoking status: Former    Packs/day: 1.00    Years: 50.00    Pack years: 50.00    Types: Cigarettes    Quit date: 05/09/2011    Years since quitting: 10.4   Smokeless tobacco: Current   Tobacco comments:    still  smokes occass  Vaping Use   Vaping Use: Not on file  Substance and Sexual Activity   Alcohol use: No   Drug use: No   Sexual activity: Not on file  Other Topics Concern   Not on file  Social History Narrative   Three adult children, married, homemaker   Social Determinants of Health   Financial Resource Strain: Not on file  Food Insecurity: Not on file  Transportation Needs: Not on file  Physical Activity: Not on file  Stress: Not on file  Social Connections: Not on file  Intimate Partner Violence: Not on file    Family History  Problem Relation Age of Onset   Breast cancer Sister 5       now age 39 stage iv   Cancer Sister        breast   Heart disease Mother    Heart disease Father    Stroke Father    Cancer Sister        breast, age 37    Current Outpatient Medications  Medication Sig Dispense Refill   aspirin EC 81 MG EC tablet Take 1 tablet (81 mg total) by mouth daily. Swallow whole. 30 tablet 11  atorvastatin (LIPITOR) 40 MG tablet Take 1 tablet (40 mg total) by mouth daily. 30 tablet 0   clopidogrel (PLAVIX) 75 MG tablet Take 1 tablet (75 mg total) by mouth daily. 21 tablet 0   lisinopril (PRINIVIL,ZESTRIL) 10 MG tablet Take 10 mg by mouth daily as needed (as directed for elevated blood pressure).     predniSONE (DELTASONE) 1 MG tablet Take 1 mg by mouth daily as needed (for Lupus flares).     senna-docusate (SENOKOT-S) 8.6-50 MG tablet Take 1 tablet by mouth at bedtime as needed for mild constipation. 30 tablet 0   No current facility-administered medications for this visit.    Allergies  Allergen Reactions   Codeine Anaphylaxis   Erythromycin Shortness Of Breath and Other (See Comments)    Patient reports reaction to ALL antibiotics   Morphine And Related Shortness Of Breath and Nausea And Vomiting   Other Shortness Of Breath and Other (See Comments)    The patient stated she cannot take any antibiotics or stronger pain meds   Penicillins  Anaphylaxis and Shortness Of Breath   Penicillins Cross Reactors Anaphylaxis and Shortness Of Breath   Valium [Diazepam] Shortness Of Breath and Other (See Comments)    Very severe breathing issues   Tamoxifen Other (See Comments)    Made patient feel depressed, fatigued,  and caused bone and joint pain    Letrozole Other (See Comments)    Made the pt's muscles and bones ache     REVIEW OF SYSTEMS:   [X]  denotes positive finding, [ ]  denotes negative finding Cardiac  Comments:  Chest pain or chest pressure:    Shortness of breath upon exertion:    Short of breath when lying flat:    Irregular heart rhythm:        Vascular    Pain in calf, thigh, or hip brought on by ambulation:    Pain in feet at night that wakes you up from your sleep:     Blood clot in your veins:    Leg swelling:         Pulmonary    Oxygen at home:    Productive cough:     Wheezing:         Neurologic    Sudden weakness in arms or legs:     Sudden numbness in arms or legs:     Sudden onset of difficulty speaking or slurred speech:    Temporary loss of vision in one eye:     Problems with dizziness:         Gastrointestinal    Blood in stool:     Vomited blood:         Genitourinary    Burning when urinating:     Blood in urine:        Psychiatric    Major depression:         Hematologic    Bleeding problems:    Problems with blood clotting too easily:        Skin    Rashes or ulcers:        Constitutional    Fever or chills:      PHYSICAL EXAMINATION:  There were no vitals filed for this visit.  General:  WDWN in NAD; vital signs documented above Gait: Not observed HENT: WNL, normocephalic Pulmonary: normal non-labored breathing , without Rales, rhonchi,  wheezing Cardiac: regular HR,  Abdomen: soft, NT, no masses Skin: without rashes Vascular Exam/Pulses:  Right Left  Radial 2+ (normal) 2+ (normal)  Ulnar 2+ (normal) 2+ (normal)  Femoral    Popliteal    DP 2+ (normal)  2+ (normal)  PT 2+ (normal) 2+ (normal)   Extremities: without ischemic changes, without Gangrene , without cellulitis; without open wounds;  Musculoskeletal: no muscle wasting or atrophy  Neurologic: A&O X 3;  No focal weakness or paresthesias are detected Psychiatric:  The pt has Normal affect.   Non-Invasive Vascular Imaging:   The patient's CT angiogram of the head and neck were reviewed demonstrating greater than 50% stenosis of the left internal carotid artery.  The circle of Willis is intact.      ASSESSMENT/PLAN:: 80 y.o. female presenting with left-sided symptomatic internal carotid artery stenosis.  Independent review of the CT scan demonstrates stenosis greater than 50% of the left internal carotid artery.  Interestingly, the patient's stroke is a right sided pontine stroke.  The pons is supplied by anterior medial, lateral, and dorsal arteries from the posterior circulation.  The left vertebral artery is dominant. Mild atherosclerotic narrowing of the proximal V1 left vertebral artery. Nonstenotic atherosclerotic plaque elsewhere within the cervical vertebral arteries. The major driver of pontine stroke is small vessel disease.   She has had no recurrent TIA, stroke, amaurosis symptoms. No vertebrobasilar symptoms. I do not think that carotid artery revascularization would be in this patient's best interest at this time, as the stroke occurred in the contralateral pons which is fed by the posterior circulation.  Patient should continue on best medical therapy, being DAPT and high intensity statin. I will continue to follow the patient's bilateral carotid arteries every 6 months. I have scheduled her for an ultrasound for baseline in the coming weeks.   Broadus John, MD [facility] [phone]

## 2021-10-05 ENCOUNTER — Ambulatory Visit
Admission: RE | Admit: 2021-10-05 | Discharge: 2021-10-05 | Disposition: A | Discharge status: Home / Self Care | Payer: Medicare HMO | Source: Ambulatory Visit | Attending: Family Medicine | Admitting: Family Medicine

## 2021-10-05 ENCOUNTER — Encounter: Payer: Self-pay | Admitting: Vascular Surgery

## 2021-10-05 ENCOUNTER — Ambulatory Visit: Payer: Medicare HMO | Admitting: Vascular Surgery

## 2021-10-05 ENCOUNTER — Other Ambulatory Visit: Payer: Self-pay

## 2021-10-05 VITALS — BP 196/93 | HR 75 | Temp 97.3°F | Resp 20 | Ht 59.0 in | Wt 98.0 lb

## 2021-10-05 DIAGNOSIS — I6522 Occlusion and stenosis of left carotid artery: Secondary | ICD-10-CM

## 2021-10-05 DIAGNOSIS — E041 Nontoxic single thyroid nodule: Secondary | ICD-10-CM | POA: Diagnosis not present

## 2021-10-06 ENCOUNTER — Other Ambulatory Visit: Payer: Self-pay

## 2021-10-06 DIAGNOSIS — I6522 Occlusion and stenosis of left carotid artery: Secondary | ICD-10-CM

## 2021-10-07 ENCOUNTER — Encounter (HOSPITAL_COMMUNITY): Payer: Medicare HMO

## 2021-10-09 ENCOUNTER — Other Ambulatory Visit: Payer: Self-pay

## 2021-10-09 DIAGNOSIS — I6522 Occlusion and stenosis of left carotid artery: Secondary | ICD-10-CM

## 2021-10-13 ENCOUNTER — Other Ambulatory Visit: Payer: Self-pay

## 2021-10-13 ENCOUNTER — Ambulatory Visit (HOSPITAL_COMMUNITY)
Admission: RE | Admit: 2021-10-13 | Discharge: 2021-10-13 | Disposition: A | Discharge status: Home / Self Care | Payer: Medicare HMO | Source: Ambulatory Visit | Attending: Internal Medicine | Admitting: Internal Medicine

## 2021-10-13 DIAGNOSIS — I6522 Occlusion and stenosis of left carotid artery: Secondary | ICD-10-CM | POA: Diagnosis not present

## 2021-10-29 DIAGNOSIS — I69354 Hemiplegia and hemiparesis following cerebral infarction affecting left non-dominant side: Secondary | ICD-10-CM | POA: Diagnosis not present

## 2021-10-29 DIAGNOSIS — M810 Age-related osteoporosis without current pathological fracture: Secondary | ICD-10-CM | POA: Diagnosis not present

## 2021-10-29 DIAGNOSIS — Z7902 Long term (current) use of antithrombotics/antiplatelets: Secondary | ICD-10-CM | POA: Diagnosis not present

## 2021-10-29 DIAGNOSIS — G43909 Migraine, unspecified, not intractable, without status migrainosus: Secondary | ICD-10-CM | POA: Diagnosis not present

## 2021-10-29 DIAGNOSIS — Z87891 Personal history of nicotine dependence: Secondary | ICD-10-CM | POA: Diagnosis not present

## 2021-10-29 DIAGNOSIS — Z7982 Long term (current) use of aspirin: Secondary | ICD-10-CM | POA: Diagnosis not present

## 2021-10-29 DIAGNOSIS — I10 Essential (primary) hypertension: Secondary | ICD-10-CM | POA: Diagnosis not present

## 2021-10-29 DIAGNOSIS — M329 Systemic lupus erythematosus, unspecified: Secondary | ICD-10-CM | POA: Diagnosis not present

## 2021-10-29 DIAGNOSIS — Z853 Personal history of malignant neoplasm of breast: Secondary | ICD-10-CM | POA: Diagnosis not present

## 2021-11-02 DIAGNOSIS — I69354 Hemiplegia and hemiparesis following cerebral infarction affecting left non-dominant side: Secondary | ICD-10-CM | POA: Diagnosis not present

## 2021-11-02 DIAGNOSIS — Z87891 Personal history of nicotine dependence: Secondary | ICD-10-CM | POA: Diagnosis not present

## 2021-11-02 DIAGNOSIS — Z853 Personal history of malignant neoplasm of breast: Secondary | ICD-10-CM | POA: Diagnosis not present

## 2021-11-02 DIAGNOSIS — M810 Age-related osteoporosis without current pathological fracture: Secondary | ICD-10-CM | POA: Diagnosis not present

## 2021-11-02 DIAGNOSIS — I10 Essential (primary) hypertension: Secondary | ICD-10-CM | POA: Diagnosis not present

## 2021-11-02 DIAGNOSIS — Z7902 Long term (current) use of antithrombotics/antiplatelets: Secondary | ICD-10-CM | POA: Diagnosis not present

## 2021-11-02 DIAGNOSIS — M329 Systemic lupus erythematosus, unspecified: Secondary | ICD-10-CM | POA: Diagnosis not present

## 2021-11-02 DIAGNOSIS — G43909 Migraine, unspecified, not intractable, without status migrainosus: Secondary | ICD-10-CM | POA: Diagnosis not present

## 2021-11-02 DIAGNOSIS — Z7982 Long term (current) use of aspirin: Secondary | ICD-10-CM | POA: Diagnosis not present

## 2021-11-19 ENCOUNTER — Ambulatory Visit: Payer: Medicare HMO | Admitting: Neurology

## 2021-11-19 ENCOUNTER — Encounter: Payer: Self-pay | Admitting: Neurology

## 2021-11-19 VITALS — BP 162/84 | HR 84 | Ht 59.75 in | Wt 104.5 lb

## 2021-11-19 DIAGNOSIS — I6350 Cerebral infarction due to unspecified occlusion or stenosis of unspecified cerebral artery: Secondary | ICD-10-CM | POA: Diagnosis not present

## 2021-11-19 DIAGNOSIS — I6381 Other cerebral infarction due to occlusion or stenosis of small artery: Secondary | ICD-10-CM

## 2021-11-19 NOTE — Progress Notes (Signed)
[facility] [address] [place]. [place] [address] [phone]       OFFICE FOLLOW-UP NOTE  [name] Date of Birth:  1941/04/03 Medical Record Number:  [id number]   HPI: Tammie Carter is a 80 year old Caucasian lady seen today for initial office follow-up visit following hospital admission for stroke in September 2022.  She is accompanied by her daughter Bethena Roys.  History is obtained from them, review of electronic medical records and opossum reviewed pertinent available imaging films in PACS. Tammie Carter is a 80 y.o. female with a medical history significant for left breast cancer s/p radiation therapy, lupus, tobacco use, essential hypertension, and migraines who presented to the ED for evaluation of left arm and leg weakness since 09/11/21.  Patient states that she was working on her computer around noon yesterday when her left leg began to feel weak and heavy.  That afternoon around 3 PM she noticed that her left arm was weak.  She drove her husband to a doctor's appointment when he noted that she had slurred speech, was driving erratically, and when they got home noticed that she had abnormal gait leaning leftward with short steps.  Her blood pressure was significantly elevated at that time.  Patient would not allow him to call 911 for further evaluation yesterday.  On 09/12/21 morning at 3 AM patient woke up from bed and attempted to ambulate to the restroom but was staggering and fell to the floor.  She was unable to get up and her husband had to help her get back to bed prompting her to come to ED for further evaluation.  CT imaging was negative for intracranial findings, however, an MRI brain was obtained revealing an acute infarct within the right pons.  She presented outside time window for thrombolytic therapy.  CT angiogram showed 50 to 60% proximal left ICA stenosis but no significant intracranial stenosis.  2D echo showed ejection fraction 55 to 60% without  cardiac source of embolism.  Cardiac telemetry monitoring did not reveal any arrhythmias.  LDL cholesterol is 109 mg percent.  Hemoglobin A1c was 6.0.  Carotid ultrasound showed 40 to 59% left ICA stenosis.  Patient was started on dual antiplatelet therapy of aspirin and Plavix for 3 weeks followed by aspirin alone.  She did participate in outpatient physical occupational therapy and has been discharged.  She has noticed improvement in the left-sided strength though her stamina is not the same and she tires easily.  She does use a cane for walking long distances.  She states she is almost able to do everything she did before the stroke but has to do it slowly.  She is not yet driving.  She is tolerating aspirin well without bruising or bleeding and has no new complaints today.  She has no prior history of strokes or TIAs or significant neurological problems.  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, ER/PR +, HER 2 -   History of radiation therapy 02/15/12 to 04/03/12   left breast   Hx of migraines    Hypertension    Lupus ([place])    Osteoporosis    Thyroglossal duct cyst     Social History:  Social History  Socioeconomic History   Marital status: Married    Spouse name: Not on file   Number of children: Not on file   Years of education: Not on file   Highest education level: Not on file  Occupational History   Not on file  Tobacco Use   Smoking status: Every Day    Packs/day: 0.50    Years: 50.00    Pack years: 25.00    Types: Cigarettes   Smokeless tobacco: Current   Tobacco comments:    still smokes occass  Vaping Use   Vaping Use: Not on file  Substance and Sexual Activity   Alcohol use: No   Drug use: No    Sexual activity: Not on file  Other Topics Concern   Not on file  Social History Narrative   Three adult children, married, homemaker   Social Determinants of Health   Financial Resource Strain: Not on file  Food Insecurity: Not on file  Transportation Needs: Not on file  Physical Activity: Not on file  Stress: Not on file  Social Connections: Not on file  Intimate Partner Violence: Not on file    Medications:   Current Outpatient Medications on File Prior to Visit  Medication Sig Dispense Refill   aspirin EC 81 MG EC tablet Take 1 tablet (81 mg total) by mouth daily. Swallow whole. 30 tablet 11   lisinopril (ZESTRIL) 20 MG tablet Take 20 mg by mouth daily.     predniSONE (DELTASONE) 1 MG tablet Take 1 mg by mouth daily as needed (for Lupus flares).     senna-docusate (SENOKOT-S) 8.6-50 MG tablet Take 1 tablet by mouth at bedtime as needed for mild constipation. 30 tablet 0   No current facility-administered medications on file prior to visit.    Allergies:   Allergies  Allergen Reactions   Codeine Anaphylaxis   Erythromycin Shortness Of Breath and Other (See Comments)    Patient reports reaction to ALL antibiotics   Morphine And Related Shortness Of Breath and Nausea And Vomiting   Other Shortness Of Breath and Other (See Comments)    The patient stated she cannot take any antibiotics or stronger pain meds   Penicillins Anaphylaxis and Shortness Of Breath   Penicillins Cross Reactors Anaphylaxis and Shortness Of Breath   Valium [Diazepam] Shortness Of Breath and Other (See Comments)    Very severe breathing issues   Tamoxifen Other (See Comments)    Made patient feel depressed, fatigued,  and caused bone and joint pain    Letrozole Other (See Comments)    Made the pt's muscles and bones ache    Physical Exam General: Frail elderly Caucasian lady, seated, in no evident distress Head: head normocephalic and atraumatic.  Neck: supple with no carotid or supraclavicular  bruits Cardiovascular: regular rate and rhythm, no murmurs Musculoskeletal: no deformity Skin:  no rash/petichiae Vascular:  Normal pulses all extremities Vitals:   11/19/21 0856  BP: (!) 162/84  Pulse: 84   Neurologic Exam Mental Status: Awake and fully alert. Oriented to place and time. Recent and remote memory intact. Attention span, concentration and fund of knowledge appropriate. Mood and affect appropriate.  Cranial Nerves: Fundoscopic exam reveals sharp disc margins. Pupils equal, briskly reactive to light. Extraocular movements full without nystagmus. Visual fields full to confrontation. Hearing intact. Facial sensation intact.  Mild left lower facial asymmetry, tongue, palate moves normally and symmetrically.  Motor: Normal bulk and tone. Normal strength in all tested extremity muscles.  Diminished fine  finger movements on the left.  Orbits right over left upper extremity. Sensory.: intact to touch ,pinprick .position and vibratory sensation.  Coordination: Rapid alternating movements normal in all extremities. Finger-to-nose is slightly impaired on the left but  heel-to-shin performed accurately bilaterally. Gait and Station: Arises from chair without difficulty. Stance is normal. Gait demonstrates normal stride length and balance with only slight dragging of the left leg. Able to heel, toe and tandem walk with moderate difficulty.  Reflexes: 1+ and symmetric. Toes downgoing.   NIHSS  2 Modified Rankin  2   ASSESSMENT: 80 year old Caucasian lady with right pontine paramedian lacunar infarct in September 2022 from small vessel disease.  Vascular risk factors of smoking, hyperlipidemia , carotid stenosis and age.  She is done well with very mild residual deficits     PLAN:I had a long d/w patient and her daughter about her recent Pontine stroke, risk for recurrent stroke/TIAs, personally independently reviewed imaging studies and stroke evaluation results and answered  questions.Continue aspirin 81 mg daily  for secondary stroke prevention and maintain strict control of hypertension with blood pressure goal below 130/90, diabetes with hemoglobin A1c goal below 6.5% and lipids with LDL cholesterol goal below 70 mg/dL. I also advised the patient to eat a healthy diet with plenty of whole grains, cereals, fruits and vegetables, exercise regularly and maintain ideal body weight .she was advised to use her cane while walking long distances and to avoid sudden movements.  I also counseled her to quit smoking completely and she stated she will try.  Followup in the future with my nurse practitioner Janett Billow in 3 months or call earlier if necessary. Greater than 50% of time during this 25 minute visit was spent on counseling,explanation of diagnosis of pontine stroke, planning of further management, discussion with patient and family and coordination of care Antony Contras, MD Note: This document was prepared with digital dictation and possible smart phrase technology. Any transcriptional errors that result from this process are unintentional

## 2021-11-19 NOTE — Patient Instructions (Signed)
I had a long d/w patient and her daughter about her recent [place] stroke, risk for recurrent stroke/TIAs, personally independently reviewed imaging studies and stroke evaluation results and answered questions.Continue aspirin 81 mg daily  for secondary stroke prevention and maintain strict control of hypertension with blood pressure goal below 130/90, diabetes with hemoglobin A1c goal below 6.5% and lipids with LDL cholesterol goal below 70 mg/dL. I also advised the patient to eat a healthy diet with plenty of whole grains, cereals, fruits and vegetables, exercise regularly and maintain ideal body weight .she was advised to use her cane while walking long distances and to avoid sudden movements.  I also counseled her to quit smoking completely and she stated she will try.  Followup in the future with my nurse practitioner Janett Billow in 3 months or call earlier if necessary. Stroke Prevention Some medical conditions and behaviors can lead to a higher chance of having a stroke. You can help prevent a stroke by eating healthy, exercising, not smoking, and managing any medical conditions you have. Stroke is a leading cause of functional impairment. Primary prevention is particularly important because a majority of strokes are first-time events. Stroke changes the lives of not only those who experience a stroke but also their family and other caregivers. How can this condition affect me? A stroke is a medical emergency and should be treated right away. A stroke can lead to brain damage and can sometimes be life-threatening. If a person gets medical treatment right away, there is a better chance of surviving and recovering from a stroke. What can increase my risk? The following medical conditions may increase your risk of a stroke: Cardiovascular disease. High blood pressure (hypertension). Diabetes. High cholesterol. Sickle cell disease. Blood clotting disorders (hypercoagulable state). Obesity. Sleep  disorders (obstructive sleep apnea). Other risk factors include: Being older than age 13. Having a history of blood clots, stroke, or mini-stroke (transient ischemic attack, TIA). Genetic factors, such as race, ethnicity, or a family history of stroke. Smoking cigarettes or using other tobacco products. Taking birth control pills, especially if you also use tobacco. Heavy use of alcohol or drugs, especially cocaine and methamphetamine. Physical inactivity. What actions can I take to prevent this? Manage your health conditions High cholesterol levels. Eating a healthy diet is important for preventing high cholesterol. If cholesterol cannot be managed through diet alone, you may need to take medicines. Take any prescribed medicines to control your cholesterol as told by your health care provider. Hypertension. To reduce your risk of stroke, try to keep your blood pressure below 130/80. Eating a healthy diet and exercising regularly are important for controlling blood pressure. If these steps are not enough to manage your blood pressure, you may need to take medicines. Take any prescribed medicines to control hypertension as told by your health care provider. Ask your health care provider if you should monitor your blood pressure at home. Have your blood pressure checked every year, even if your blood pressure is normal. Blood pressure increases with age and some medical conditions. Diabetes. Eating a healthy diet and exercising regularly are important parts of managing your blood sugar (glucose). If your blood sugar cannot be managed through diet and exercise, you may need to take medicines. Take any prescribed medicines to control your diabetes as told by your health care provider. Get evaluated for obstructive sleep apnea. Talk to your health care provider about getting a sleep evaluation if you snore a lot or have excessive sleepiness. Make sure that any  other medical conditions you have,  such as atrial fibrillation or atherosclerosis, are managed. Nutrition Follow instructions from your health care provider about what to eat or drink to help manage your health condition. These instructions may include: Reducing your daily calorie intake. Limiting how much salt (sodium) you use to 1,500 milligrams (mg) each day. Using only healthy fats for cooking, such as olive oil, canola oil, or sunflower oil. Eating healthy foods. You can do this by: Choosing foods that are high in fiber, such as whole grains, and fresh fruits and vegetables. Eating at least 5 servings of fruits and vegetables a day. Try to fill one-half of your plate with fruits and vegetables at each meal. Choosing lean protein foods, such as lean cuts of meat, poultry without skin, fish, tofu, beans, and nuts. Eating low-fat dairy products. Avoiding foods that are high in sodium. This can help lower blood pressure. Avoiding foods that have saturated fat, trans fat, and cholesterol. This can help prevent high cholesterol. Avoiding processed and prepared foods. Counting your daily carbohydrate intake.  Lifestyle If you drink alcohol: Limit how much you have to: 0-1 drink a day for women who are not pregnant. 0-2 drinks a day for men. Know how much alcohol is in your drink. In the [place] one drink equals one 12 oz bottle of beer (325mL), one 5 oz glass of wine (162mL), or one 1 oz glass of hard liquor (70mL). Do not use any products that contain nicotine or tobacco. These products include cigarettes, chewing tobacco, and vaping devices, such as e-cigarettes. If you need help quitting, ask your health care provider. Avoid secondhand smoke. Do not use drugs. Activity  Try to stay at a healthy weight. Get at least 30 minutes of exercise on most days, such as: Fast walking. Biking. Swimming. Medicines Take over-the-counter and prescription medicines only as told by your health care provider. Aspirin or blood thinners  (antiplatelets or anticoagulants) may be recommended to reduce your risk of forming blood clots that can lead to stroke. Avoid taking birth control pills. Talk to your health care provider about the risks of taking birth control pills if: You are over 5 years old. You smoke. You get very bad headaches. You have had a blood clot. Where to find more information American Stroke Association: www.strokeassociation.org Get help right away if: You or a loved one has any symptoms of a stroke. "BE FAST" is an easy way to remember the main warning signs of a stroke: B - Balance. Signs are dizziness, sudden trouble walking, or loss of balance. E - Eyes. Signs are trouble seeing or a sudden change in vision. F - Face. Signs are sudden weakness or numbness of the face, or the face or eyelid drooping on one side. A - Arms. Signs are weakness or numbness in an arm. This happens suddenly and usually on one side of the body. S - Speech. Signs are sudden trouble speaking, slurred speech, or trouble understanding what people say. T - Time. Time to call emergency services. Write down what time symptoms started. You or a loved one has other signs of a stroke, such as: A sudden, severe headache with no known cause. Nausea or vomiting. Seizure. These symptoms may represent a serious problem that is an emergency. Do not wait to see if the symptoms will go away. Get medical help right away. Call your local emergency services (911 in the [place] Do not drive yourself to the hospital. Summary You can help to prevent  a stroke by eating healthy, exercising, not smoking, limiting alcohol intake, and managing any medical conditions you may have. Do not use any products that contain nicotine or tobacco. These include cigarettes, chewing tobacco, and vaping devices, such as e-cigarettes. If you need help quitting, ask your health care provider. Remember "BE FAST" for warning signs of a stroke. Get help right away if you or a  loved one has any of these signs. This information is not intended to replace advice given to you by your health care provider. Make sure you discuss any questions you have with your health care provider. Document Revised: 07/07/2020 Document Reviewed: 07/07/2020 [employer] Patient Education  [place].

## 2021-11-30 DIAGNOSIS — I130 Hypertensive heart and chronic kidney disease with heart failure and stage 1 through stage 4 chronic kidney disease, or unspecified chronic kidney disease: Secondary | ICD-10-CM | POA: Diagnosis not present

## 2021-11-30 DIAGNOSIS — I5189 Other ill-defined heart diseases: Secondary | ICD-10-CM | POA: Diagnosis not present

## 2021-11-30 DIAGNOSIS — N1831 Chronic kidney disease, stage 3a: Secondary | ICD-10-CM | POA: Diagnosis not present

## 2022-10-04 ENCOUNTER — Other Ambulatory Visit: Payer: Self-pay | Admitting: Vascular Surgery

## 2022-10-04 DIAGNOSIS — I6522 Occlusion and stenosis of left carotid artery: Secondary | ICD-10-CM

## 2022-10-14 DIAGNOSIS — R7989 Other specified abnormal findings of blood chemistry: Secondary | ICD-10-CM | POA: Diagnosis not present

## 2022-10-14 DIAGNOSIS — E785 Hyperlipidemia, unspecified: Secondary | ICD-10-CM | POA: Diagnosis not present

## 2022-10-14 DIAGNOSIS — M810 Age-related osteoporosis without current pathological fracture: Secondary | ICD-10-CM | POA: Diagnosis not present

## 2022-10-14 DIAGNOSIS — E041 Nontoxic single thyroid nodule: Secondary | ICD-10-CM | POA: Diagnosis not present

## 2022-10-29 DIAGNOSIS — R82998 Other abnormal findings in urine: Secondary | ICD-10-CM | POA: Diagnosis not present

## 2022-11-16 DIAGNOSIS — Z1339 Encounter for screening examination for other mental health and behavioral disorders: Secondary | ICD-10-CM | POA: Diagnosis not present

## 2022-11-16 DIAGNOSIS — Z1331 Encounter for screening for depression: Secondary | ICD-10-CM | POA: Diagnosis not present

## 2022-11-16 DIAGNOSIS — I130 Hypertensive heart and chronic kidney disease with heart failure and stage 1 through stage 4 chronic kidney disease, or unspecified chronic kidney disease: Secondary | ICD-10-CM | POA: Diagnosis not present

## 2022-11-16 DIAGNOSIS — I6522 Occlusion and stenosis of left carotid artery: Secondary | ICD-10-CM | POA: Diagnosis not present

## 2022-11-16 DIAGNOSIS — I5189 Other ill-defined heart diseases: Secondary | ICD-10-CM | POA: Diagnosis not present

## 2022-11-16 DIAGNOSIS — I6990 Unspecified sequelae of unspecified cerebrovascular disease: Secondary | ICD-10-CM | POA: Diagnosis not present

## 2022-11-16 DIAGNOSIS — Z0000 Encounter for general adult medical examination without abnormal findings: Secondary | ICD-10-CM | POA: Diagnosis not present

## 2022-11-16 DIAGNOSIS — M329 Systemic lupus erythematosus, unspecified: Secondary | ICD-10-CM | POA: Diagnosis not present

## 2022-11-16 DIAGNOSIS — M65341 Trigger finger, right ring finger: Secondary | ICD-10-CM | POA: Diagnosis not present

## 2022-11-16 DIAGNOSIS — J449 Chronic obstructive pulmonary disease, unspecified: Secondary | ICD-10-CM | POA: Diagnosis not present

## 2022-11-16 DIAGNOSIS — I700 Atherosclerosis of aorta: Secondary | ICD-10-CM | POA: Diagnosis not present

## 2022-11-16 DIAGNOSIS — I671 Cerebral aneurysm, nonruptured: Secondary | ICD-10-CM | POA: Diagnosis not present

## 2022-11-16 DIAGNOSIS — E041 Nontoxic single thyroid nodule: Secondary | ICD-10-CM | POA: Diagnosis not present

## 2022-11-16 DIAGNOSIS — N1831 Chronic kidney disease, stage 3a: Secondary | ICD-10-CM | POA: Diagnosis not present

## 2023-11-14 DIAGNOSIS — E785 Hyperlipidemia, unspecified: Secondary | ICD-10-CM | POA: Diagnosis not present

## 2023-11-21 DIAGNOSIS — E041 Nontoxic single thyroid nodule: Secondary | ICD-10-CM | POA: Diagnosis not present

## 2023-11-21 DIAGNOSIS — M329 Systemic lupus erythematosus, unspecified: Secondary | ICD-10-CM | POA: Diagnosis not present

## 2023-11-21 DIAGNOSIS — R82998 Other abnormal findings in urine: Secondary | ICD-10-CM | POA: Diagnosis not present

## 2023-11-21 DIAGNOSIS — E785 Hyperlipidemia, unspecified: Secondary | ICD-10-CM | POA: Diagnosis not present

## 2023-11-21 DIAGNOSIS — J449 Chronic obstructive pulmonary disease, unspecified: Secondary | ICD-10-CM | POA: Diagnosis not present

## 2023-11-21 DIAGNOSIS — I6990 Unspecified sequelae of unspecified cerebrovascular disease: Secondary | ICD-10-CM | POA: Diagnosis not present

## 2023-11-21 DIAGNOSIS — Z1331 Encounter for screening for depression: Secondary | ICD-10-CM | POA: Diagnosis not present

## 2023-11-21 DIAGNOSIS — H209 Unspecified iridocyclitis: Secondary | ICD-10-CM | POA: Diagnosis not present

## 2023-11-21 DIAGNOSIS — F17210 Nicotine dependence, cigarettes, uncomplicated: Secondary | ICD-10-CM | POA: Diagnosis not present

## 2023-11-21 DIAGNOSIS — I700 Atherosclerosis of aorta: Secondary | ICD-10-CM | POA: Diagnosis not present

## 2023-11-21 DIAGNOSIS — Z1339 Encounter for screening examination for other mental health and behavioral disorders: Secondary | ICD-10-CM | POA: Diagnosis not present

## 2023-11-21 DIAGNOSIS — I1310 Hypertensive heart and chronic kidney disease without heart failure, with stage 1 through stage 4 chronic kidney disease, or unspecified chronic kidney disease: Secondary | ICD-10-CM | POA: Diagnosis not present

## 2023-11-21 DIAGNOSIS — Z0000 Encounter for general adult medical examination without abnormal findings: Secondary | ICD-10-CM | POA: Diagnosis not present

## 2023-11-21 DIAGNOSIS — R7302 Impaired glucose tolerance (oral): Secondary | ICD-10-CM | POA: Diagnosis not present

## 2023-11-21 DIAGNOSIS — N1831 Chronic kidney disease, stage 3a: Secondary | ICD-10-CM | POA: Diagnosis not present

## 2023-11-21 DIAGNOSIS — I671 Cerebral aneurysm, nonruptured: Secondary | ICD-10-CM | POA: Diagnosis not present

## 2024-11-19 DIAGNOSIS — E7849 Other hyperlipidemia: Secondary | ICD-10-CM | POA: Diagnosis not present

## 2024-11-29 DIAGNOSIS — E041 Nontoxic single thyroid nodule: Secondary | ICD-10-CM | POA: Diagnosis not present

## 2024-11-29 DIAGNOSIS — R82998 Other abnormal findings in urine: Secondary | ICD-10-CM | POA: Diagnosis not present

## 2024-11-29 DIAGNOSIS — J449 Chronic obstructive pulmonary disease, unspecified: Secondary | ICD-10-CM | POA: Diagnosis not present

## 2024-11-29 DIAGNOSIS — N1831 Chronic kidney disease, stage 3a: Secondary | ICD-10-CM | POA: Diagnosis not present

## 2024-11-29 DIAGNOSIS — I700 Atherosclerosis of aorta: Secondary | ICD-10-CM | POA: Diagnosis not present

## 2024-11-29 DIAGNOSIS — I5189 Other ill-defined heart diseases: Secondary | ICD-10-CM | POA: Diagnosis not present

## 2024-11-29 DIAGNOSIS — Z1339 Encounter for screening examination for other mental health and behavioral disorders: Secondary | ICD-10-CM | POA: Diagnosis not present

## 2024-11-29 DIAGNOSIS — M810 Age-related osteoporosis without current pathological fracture: Secondary | ICD-10-CM | POA: Diagnosis not present

## 2024-11-29 DIAGNOSIS — Z0000 Encounter for general adult medical examination without abnormal findings: Secondary | ICD-10-CM | POA: Diagnosis not present

## 2024-11-29 DIAGNOSIS — I130 Hypertensive heart and chronic kidney disease with heart failure and stage 1 through stage 4 chronic kidney disease, or unspecified chronic kidney disease: Secondary | ICD-10-CM | POA: Diagnosis not present

## 2024-11-29 DIAGNOSIS — E785 Hyperlipidemia, unspecified: Secondary | ICD-10-CM | POA: Diagnosis not present

## 2024-11-29 DIAGNOSIS — I10 Essential (primary) hypertension: Secondary | ICD-10-CM | POA: Diagnosis not present

## 2024-11-29 DIAGNOSIS — I6990 Unspecified sequelae of unspecified cerebrovascular disease: Secondary | ICD-10-CM | POA: Diagnosis not present

## 2024-11-29 DIAGNOSIS — M329 Systemic lupus erythematosus, unspecified: Secondary | ICD-10-CM | POA: Diagnosis not present

## 2024-11-29 DIAGNOSIS — F17210 Nicotine dependence, cigarettes, uncomplicated: Secondary | ICD-10-CM | POA: Diagnosis not present

## 2024-11-29 DIAGNOSIS — Z853 Personal history of malignant neoplasm of breast: Secondary | ICD-10-CM | POA: Diagnosis not present

## 2024-11-29 DIAGNOSIS — Q892 Congenital malformations of other endocrine glands: Secondary | ICD-10-CM | POA: Diagnosis not present

## 2024-11-29 DIAGNOSIS — Z1331 Encounter for screening for depression: Secondary | ICD-10-CM | POA: Diagnosis not present
